# Patient Record
Sex: Male | Born: 1950
Health system: Southern US, Community
[De-identification: ages and names within clinical notes are randomized; demographics above are authoritative.]

## PROBLEM LIST (undated history)

## (undated) DIAGNOSIS — M069 Rheumatoid arthritis, unspecified: Secondary | ICD-10-CM

## (undated) DIAGNOSIS — E785 Hyperlipidemia, unspecified: Secondary | ICD-10-CM

## (undated) DIAGNOSIS — M810 Age-related osteoporosis without current pathological fracture: Secondary | ICD-10-CM

## (undated) HISTORY — DX: Hyperlipidemia, unspecified: E78.5

## (undated) HISTORY — DX: Age-related osteoporosis without current pathological fracture: M81.0

## (undated) HISTORY — DX: Rheumatoid arthritis, unspecified: M06.9

---

## 1999-08-23 ENCOUNTER — Ambulatory Visit (HOSPITAL_COMMUNITY): Admission: RE | Admit: 1999-08-23 | Discharge: 1999-08-23 | Payer: Self-pay | Admitting: *Deleted

## 2004-10-04 ENCOUNTER — Ambulatory Visit (HOSPITAL_COMMUNITY): Admission: RE | Admit: 2004-10-04 | Discharge: 2004-10-04 | Payer: Self-pay | Admitting: *Deleted

## 2007-12-26 ENCOUNTER — Ambulatory Visit: Admission: RE | Admit: 2007-12-26 | Discharge: 2008-02-20 | Payer: Self-pay | Admitting: Radiation Oncology

## 2008-03-04 ENCOUNTER — Ambulatory Visit (HOSPITAL_COMMUNITY): Admission: RE | Admit: 2008-03-04 | Discharge: 2008-03-05 | Payer: Self-pay | Admitting: Orthopaedic Surgery

## 2008-05-26 ENCOUNTER — Encounter (INDEPENDENT_AMBULATORY_CARE_PROVIDER_SITE_OTHER): Payer: Self-pay | Admitting: Urology

## 2008-05-26 ENCOUNTER — Inpatient Hospital Stay (HOSPITAL_COMMUNITY): Admission: RE | Admit: 2008-05-26 | Discharge: 2008-05-27 | Payer: Self-pay | Admitting: Urology
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
## 2010-11-09 NOTE — Op Note (Signed)
NAME:  Eric Solomon, Eric Solomon NO.:  [id number]   MEDICAL RECORD NO.:  [id number]          PATIENT TYPE:  INP   LOCATION:  1423                         FACILITY:  [facility]   PHYSICIAN:  Heloise Purpura, MD      DATE OF BIRTH:  [birth year]   DATE OF PROCEDURE:  05/26/2008  DATE OF DISCHARGE:                               OPERATIVE REPORT   PREOPERATIVE DIAGNOSIS:  Clinically localized adenocarcinoma of the  prostate (clinical stage T1C, NX, MX).   POSTOPERATIVE DIAGNOSIS:  Clinically localized adenocarcinoma of the  prostate (clinical stage T1C, NX, MX).   PROCEDURE:  Robotic assisted laparoscopic radical prostatectomy  (bilateral nerve sparing).   SURGEON:  Heloise Purpura, MD   ASSISTANT:  Delia Chimes, nurse practitioner.   ANESTHESIA:  General.   COMPLICATIONS:  None.   ESTIMATED BLOOD LOSS:  100 mL.   INTRAVENOUS FLUID:  1800 mL of lactated Ringer's.   SPECIMENS:  Prostate and seminal vesicles.   DISPOSITION OF SPECIMENS:  To pathology.   DRAINS:  1. A 20-French coude catheter.  2. A #19 Blake pelvic drain.   INDICATIONS:  Eric Solomon is a 60 year old gentleman with clinically  localized adenocarcinoma of the prostate.  After a discussion regarding  management options for treatment, he elected to proceed with surgical  therapy and the above procedure.  Potential risks, complications, and  alternative treatment options were discussed in detail and informed  consent was obtained.   DESCRIPTION OF PROCEDURE:  The patient was taken to the operating room  and a general anesthetic was administered.  He was given preoperative  antibiotics, placed in the dorsal lithotomy position, prepped and draped  in the usual sterile fashion.  Next, a preoperative time-out was  performed.  A Foley catheter was then inserted into the bladder and a  site was selected just superior to the umbilicus for placement of camera  port.  This was placed using a standard open Hasson  technique which  allowed entry into the peritoneal cavity under direct vision and without  difficulty.  A 12 mm port was then placed and a pneumoperitoneum  established.  The 0 degrees lens was then used to inspect the abdomen  and there was no evidence for any intra-abdominal injuries or other  abnormalities.  The remaining ports were then placed.  Bilateral 8 mm  robotic ports were placed 10 cm lateral to and just inferior to the  camera port site.  An additional 8 mm robotic port was placed in the far  left lateral abdominal wall.  A 5 mm port was placed between the camera  port and the right robotic port and an additional 12 mm was placed in  the far right lateral abdominal wall for laparoscopic assistance.  All  ports were placed under direct vision without difficulty.  The surgical  cart was then docked.  The bladder was then reflected posteriorly with  the aid of the cautery scissors allowing the prostate and endopelvic  fascia to be exposed.  The endopelvic fascia was then incised from the  apex back to the base of  the prostate bilaterally and the underlying  levator muscle fibers were swept laterally off the prostate thereby  isolating the dorsal venous complex.  The dorsal vein was then stapled  and divided with the 60 mm Ethicon stapler.  Attention then turned to  the bladder neck, which was divided anteriorly thereby exposing the  Foley catheter.  The catheter balloon was deflated.  The catheter was  brought into the operative field and used to retract the prostate  anteriorly.  This exposed the posterior bladder neck which was then  divided and dissection proceeded posteriorly between the prostate and  bladder neck until the vasa deferentia and seminal vesicles were  identified.  The vasa deferentia were isolated and divided and lifted  anteriorly.  The seminal vesicles were then dissected down to their tips  with care to control the seminal vesicle arterial blood supply.   The  seminal vesicles were then lifted anteriorly and the space between  Denonvilliers fascia and the anterior rectum was bluntly developed  thereby isolating the vascular pedicles of prostate.  The lateral  prostatic fascia was then incised sharply allowing the neurovascular  bundles to be released bilaterally.  The vascular pedicles of prostate  was then ligated with Hem-o-lok clips and divided with sharp cold  scissor dissection above the level of the neurovascular bundles.  The  neurovascular bundles then swept off the apex of the prostate and  urethra.  The urethra was then sharply divided allowing the prostate  specimen to be disarticulated.  The pelvis was copiously irrigated and  hemostasis was ensured.  There was no evidence for a rectal injury.  Attention then turned to the urethral anastomosis.  A 2-0 Vicryl slip-  knot was placed at the 6 o'clock position between Denonvilliers fascia,  the posterior bladder neck, and the posterior urethra to reapproximate  these structures.  A double-armed 3-0 Monocryl suture was then used  perform a 360 degree running tension-free anastomosis.  A new 20-French  coude catheter was inserted into the bladder and irrigated.  There were  no blood clots within the bladder.  The anastomosis appeared be  watertight.  A #19 Blake drain was then brought through the left robotic  port and appropriately positioned in the pelvis.  It was secured to the  skin with a nylon suture.  The surgical cart was then undocked.  The  right lateral 12 mm port site was then closed with a 0 Vicryl suture  placed with the aid of the suture passer device.  The remaining ports  were then removed under direct vision and prostate specimen was removed  intact within the Endopouch retrieval bag via the periumbilical port  site.  This fascial opening was then closed with a running 0 Vicryl  suture.  All port sites were injected with 0.25% Marcaine and  reapproximated at the  skin level with staples.  Sterile dressings were  applied.  The patient appeared to tolerate the procedure well without  complications.  He was able to be extubated and transferred to recovery  room satisfactory condition.      Heloise Purpura, MD  Electronically Signed     LB/MEDQ  D:  05/26/2008  T:  05/26/2008  Job:  [id number]

## 2010-11-12 NOTE — Op Note (Signed)
NAME:  Eric Solomon, Eric Solomon NO.:  [id number]   MEDICAL RECORD NO.:  [id number]          PATIENT TYPE:  AMB   LOCATION:  ENDO                         FACILITY:  [facility]   PHYSICIAN:  Georgiana Spinner, M.D.    DATE OF BIRTH:  1950/07/24   DATE OF PROCEDURE:  10/04/2004  DATE OF DISCHARGE:                                 OPERATIVE REPORT   PROCEDURE:  Colonoscopy.   ENDOSCOPIST:  Georgiana Spinner, M.D.   INDICATIONS FOR PROCEDURE:  Hemoccult positivity, a positive family history  of colon cancer.   ANESTHESIA:  Demerol 100 mg, Versed 10 mg.   DESCRIPTION OF PROCEDURE:  With the patient mildly sedated in the left  lateral decubitus position, a rectal examination was performed which was  unremarkable except there was a small anal stricture.  Subsequently the  Olympus videoscopic colonoscope was inserted in the rectum and passed under  direct vision through the colon.  Of note, just distal to the hepatic  flexure the colon made a turn, and it appeared that the lumen closed. We  were able to get through this, and then with some difficulty thereafter,  with pressure applied to the abdomen, the patient rolled to his back and  subsequently to his right side, and we were able to reach the cecum  identified by the base of the cecum and the ileocecal valve, both of which  were photographed.  From this point the colonoscope was then slowly  withdrawn, taking circumferential views of the colonic mucosa, stopping only  in the rectum which appeared normal on direct and on retroflexed view.  The  endoscope was straightened and withdrawn.  The patient's vital signs, pulse  oximeter remained stable.   The patient tolerated the procedure well without apparent complications.   FINDINGS:  Negative colonoscopic examination.  No negative findings.   PLAN:  Repeat examination in five years.      GMO/MEDQ  D:  10/04/2004  T:  10/04/2004  Job:  [id number]

## 2011-03-29 LAB — TYPE AND SCREEN
ABO/RH(D): O POS
Antibody Screen: NEGATIVE

## 2011-03-29 LAB — CBC
Hemoglobin: 15.1 g/dL (ref 13.0–17.0)
MCHC: 34 g/dL (ref 30.0–36.0)
Platelets: 171 10*3/uL (ref 150–400)

## 2011-03-29 LAB — HEMOGLOBIN AND HEMATOCRIT, BLOOD
HCT: 40.1 % (ref 39.0–52.0)
Hemoglobin: 13.7 g/dL (ref 13.0–17.0)

## 2011-03-29 LAB — BASIC METABOLIC PANEL
CO2: 29 mEq/L (ref 19–32)
Calcium: 9.7 mg/dL (ref 8.4–10.5)
Chloride: 105 mEq/L (ref 96–112)
GFR calc non Af Amer: >60 mL/min (ref >60)
Glucose, Bld: 137 mg/dL — ABNORMAL HIGH (ref 70–99)
Potassium: 4.3 mEq/L (ref 3.5–5.1)
Sodium: 142 mEq/L (ref 135–145)

## 2011-03-30 LAB — BASIC METABOLIC PANEL
BUN: 15
Calcium: 9.4
Creatinine, Ser: 0.96
Glucose, Bld: 90
Sodium: 138

## 2011-03-30 LAB — CBC
HCT: 43
Hemoglobin: 14.5
MCHC: 33.6
RDW: 13.2
WBC: 5.7

## 2011-04-01 LAB — HEMOGLOBIN AND HEMATOCRIT, BLOOD: HCT: 36.1 % — ABNORMAL LOW (ref 39.0–52.0)

## 2014-06-04 DIAGNOSIS — M0589 Other rheumatoid arthritis with rheumatoid factor of multiple sites: Secondary | ICD-10-CM | POA: Diagnosis not present

## 2014-09-03 DIAGNOSIS — M8580 Other specified disorders of bone density and structure, unspecified site: Secondary | ICD-10-CM | POA: Diagnosis not present

## 2014-09-03 DIAGNOSIS — M0589 Other rheumatoid arthritis with rheumatoid factor of multiple sites: Secondary | ICD-10-CM | POA: Diagnosis not present

## 2014-12-10 DIAGNOSIS — M0589 Other rheumatoid arthritis with rheumatoid factor of multiple sites: Secondary | ICD-10-CM | POA: Diagnosis not present

## 2014-12-10 DIAGNOSIS — Z23 Encounter for immunization: Secondary | ICD-10-CM | POA: Diagnosis not present

## 2014-12-10 DIAGNOSIS — Z0000 Encounter for general adult medical examination without abnormal findings: Secondary | ICD-10-CM | POA: Diagnosis not present

## 2014-12-10 DIAGNOSIS — Z125 Encounter for screening for malignant neoplasm of prostate: Secondary | ICD-10-CM | POA: Diagnosis not present

## 2014-12-10 DIAGNOSIS — Z79899 Other long term (current) drug therapy: Secondary | ICD-10-CM | POA: Diagnosis not present

## 2014-12-15 DIAGNOSIS — M05751 Rheumatoid arthritis with rheumatoid factor of right hip without organ or systems involvement: Secondary | ICD-10-CM | POA: Diagnosis not present

## 2014-12-15 DIAGNOSIS — R312 Other microscopic hematuria: Secondary | ICD-10-CM | POA: Diagnosis not present

## 2014-12-15 DIAGNOSIS — I8390 Asymptomatic varicose veins of unspecified lower extremity: Secondary | ICD-10-CM | POA: Diagnosis not present

## 2014-12-15 DIAGNOSIS — Z0000 Encounter for general adult medical examination without abnormal findings: Secondary | ICD-10-CM | POA: Diagnosis not present

## 2014-12-15 DIAGNOSIS — Z800 Family history of malignant neoplasm of digestive organs: Secondary | ICD-10-CM | POA: Diagnosis not present

## 2015-01-19 DIAGNOSIS — Z86010 Personal history of colonic polyps: Secondary | ICD-10-CM | POA: Diagnosis not present

## 2015-01-19 DIAGNOSIS — Z09 Encounter for follow-up examination after completed treatment for conditions other than malignant neoplasm: Secondary | ICD-10-CM | POA: Diagnosis not present

## 2015-03-05 DIAGNOSIS — Z79899 Other long term (current) drug therapy: Secondary | ICD-10-CM | POA: Diagnosis not present

## 2015-03-05 DIAGNOSIS — M0589 Other rheumatoid arthritis with rheumatoid factor of multiple sites: Secondary | ICD-10-CM | POA: Diagnosis not present

## 2015-03-05 DIAGNOSIS — M8580 Other specified disorders of bone density and structure, unspecified site: Secondary | ICD-10-CM | POA: Diagnosis not present

## 2015-04-21 DIAGNOSIS — Z23 Encounter for immunization: Secondary | ICD-10-CM | POA: Diagnosis not present

## 2015-06-04 DIAGNOSIS — Z79899 Other long term (current) drug therapy: Secondary | ICD-10-CM | POA: Diagnosis not present

## 2015-06-04 DIAGNOSIS — M0589 Other rheumatoid arthritis with rheumatoid factor of multiple sites: Secondary | ICD-10-CM | POA: Diagnosis not present

## 2015-06-08 DIAGNOSIS — H5203 Hypermetropia, bilateral: Secondary | ICD-10-CM | POA: Diagnosis not present

## 2015-06-08 DIAGNOSIS — H524 Presbyopia: Secondary | ICD-10-CM | POA: Diagnosis not present

## 2015-06-08 DIAGNOSIS — H52221 Regular astigmatism, right eye: Secondary | ICD-10-CM | POA: Diagnosis not present

## 2015-06-08 DIAGNOSIS — H2513 Age-related nuclear cataract, bilateral: Secondary | ICD-10-CM | POA: Diagnosis not present

## 2015-09-02 DIAGNOSIS — M0589 Other rheumatoid arthritis with rheumatoid factor of multiple sites: Secondary | ICD-10-CM | POA: Diagnosis not present

## 2015-09-02 DIAGNOSIS — Z79899 Other long term (current) drug therapy: Secondary | ICD-10-CM | POA: Diagnosis not present

## 2015-09-02 DIAGNOSIS — M8580 Other specified disorders of bone density and structure, unspecified site: Secondary | ICD-10-CM | POA: Diagnosis not present

## 2015-12-09 DIAGNOSIS — M810 Age-related osteoporosis without current pathological fracture: Secondary | ICD-10-CM | POA: Diagnosis not present

## 2015-12-14 DIAGNOSIS — Z125 Encounter for screening for malignant neoplasm of prostate: Secondary | ICD-10-CM | POA: Diagnosis not present

## 2015-12-14 DIAGNOSIS — E7800 Pure hypercholesterolemia, unspecified: Secondary | ICD-10-CM | POA: Diagnosis not present

## 2015-12-14 DIAGNOSIS — Z0000 Encounter for general adult medical examination without abnormal findings: Secondary | ICD-10-CM | POA: Diagnosis not present

## 2015-12-14 DIAGNOSIS — M05751 Rheumatoid arthritis with rheumatoid factor of right hip without organ or systems involvement: Secondary | ICD-10-CM | POA: Diagnosis not present

## 2015-12-21 DIAGNOSIS — I8390 Asymptomatic varicose veins of unspecified lower extremity: Secondary | ICD-10-CM | POA: Diagnosis not present

## 2015-12-21 DIAGNOSIS — M2010 Hallux valgus (acquired), unspecified foot: Secondary | ICD-10-CM | POA: Diagnosis not present

## 2015-12-21 DIAGNOSIS — M05751 Rheumatoid arthritis with rheumatoid factor of right hip without organ or systems involvement: Secondary | ICD-10-CM | POA: Diagnosis not present

## 2015-12-21 DIAGNOSIS — M810 Age-related osteoporosis without current pathological fracture: Secondary | ICD-10-CM | POA: Diagnosis not present

## 2015-12-22 DIAGNOSIS — N4340 Spermatocele of epididymis, unspecified: Secondary | ICD-10-CM | POA: Diagnosis not present

## 2015-12-22 DIAGNOSIS — Z79899 Other long term (current) drug therapy: Secondary | ICD-10-CM | POA: Diagnosis not present

## 2015-12-22 DIAGNOSIS — Z9079 Acquired absence of other genital organ(s): Secondary | ICD-10-CM | POA: Diagnosis not present

## 2015-12-22 DIAGNOSIS — M810 Age-related osteoporosis without current pathological fracture: Secondary | ICD-10-CM | POA: Diagnosis not present

## 2016-01-28 DIAGNOSIS — M810 Age-related osteoporosis without current pathological fracture: Secondary | ICD-10-CM | POA: Diagnosis not present

## 2016-02-22 ENCOUNTER — Other Ambulatory Visit: Payer: Self-pay

## 2016-03-07 DIAGNOSIS — M8580 Other specified disorders of bone density and structure, unspecified site: Secondary | ICD-10-CM | POA: Diagnosis not present

## 2016-03-07 DIAGNOSIS — Z23 Encounter for immunization: Secondary | ICD-10-CM | POA: Diagnosis not present

## 2016-03-07 DIAGNOSIS — Z79899 Other long term (current) drug therapy: Secondary | ICD-10-CM | POA: Diagnosis not present

## 2016-03-07 DIAGNOSIS — M0589 Other rheumatoid arthritis with rheumatoid factor of multiple sites: Secondary | ICD-10-CM | POA: Diagnosis not present

## 2016-06-06 DIAGNOSIS — Z79899 Other long term (current) drug therapy: Secondary | ICD-10-CM | POA: Diagnosis not present

## 2016-06-06 DIAGNOSIS — M0589 Other rheumatoid arthritis with rheumatoid factor of multiple sites: Secondary | ICD-10-CM | POA: Diagnosis not present

## 2016-08-02 DIAGNOSIS — M810 Age-related osteoporosis without current pathological fracture: Secondary | ICD-10-CM | POA: Diagnosis not present

## 2016-09-08 DIAGNOSIS — M05751 Rheumatoid arthritis with rheumatoid factor of right hip without organ or systems involvement: Secondary | ICD-10-CM | POA: Diagnosis not present

## 2016-09-08 DIAGNOSIS — S63233A Subluxation of proximal interphalangeal joint of left middle finger, initial encounter: Secondary | ICD-10-CM | POA: Diagnosis not present

## 2016-09-08 DIAGNOSIS — S63235A Subluxation of proximal interphalangeal joint of left ring finger, initial encounter: Secondary | ICD-10-CM | POA: Diagnosis not present

## 2016-09-08 DIAGNOSIS — M0589 Other rheumatoid arthritis with rheumatoid factor of multiple sites: Secondary | ICD-10-CM | POA: Diagnosis not present

## 2016-09-08 DIAGNOSIS — S63237A Subluxation of proximal interphalangeal joint of left little finger, initial encounter: Secondary | ICD-10-CM | POA: Diagnosis not present

## 2016-09-30 DIAGNOSIS — M0589 Other rheumatoid arthritis with rheumatoid factor of multiple sites: Secondary | ICD-10-CM | POA: Diagnosis not present

## 2016-09-30 DIAGNOSIS — Z79899 Other long term (current) drug therapy: Secondary | ICD-10-CM | POA: Diagnosis not present

## 2016-09-30 DIAGNOSIS — M05751 Rheumatoid arthritis with rheumatoid factor of right hip without organ or systems involvement: Secondary | ICD-10-CM | POA: Diagnosis not present

## 2016-11-03 DIAGNOSIS — J019 Acute sinusitis, unspecified: Secondary | ICD-10-CM | POA: Diagnosis not present

## 2016-11-23 DIAGNOSIS — H2513 Age-related nuclear cataract, bilateral: Secondary | ICD-10-CM | POA: Diagnosis not present

## 2016-11-23 DIAGNOSIS — H52223 Regular astigmatism, bilateral: Secondary | ICD-10-CM | POA: Diagnosis not present

## 2016-11-23 DIAGNOSIS — H5203 Hypermetropia, bilateral: Secondary | ICD-10-CM | POA: Diagnosis not present

## 2016-12-21 DIAGNOSIS — Z125 Encounter for screening for malignant neoplasm of prostate: Secondary | ICD-10-CM | POA: Diagnosis not present

## 2016-12-21 DIAGNOSIS — E559 Vitamin D deficiency, unspecified: Secondary | ICD-10-CM | POA: Diagnosis not present

## 2016-12-21 DIAGNOSIS — M05751 Rheumatoid arthritis with rheumatoid factor of right hip without organ or systems involvement: Secondary | ICD-10-CM | POA: Diagnosis not present

## 2016-12-21 DIAGNOSIS — Z0000 Encounter for general adult medical examination without abnormal findings: Secondary | ICD-10-CM | POA: Diagnosis not present

## 2016-12-21 DIAGNOSIS — Z23 Encounter for immunization: Secondary | ICD-10-CM | POA: Diagnosis not present

## 2016-12-21 DIAGNOSIS — M0589 Other rheumatoid arthritis with rheumatoid factor of multiple sites: Secondary | ICD-10-CM | POA: Diagnosis not present

## 2016-12-21 DIAGNOSIS — E7801 Familial hypercholesterolemia: Secondary | ICD-10-CM | POA: Diagnosis not present

## 2016-12-26 DIAGNOSIS — Z7289 Other problems related to lifestyle: Secondary | ICD-10-CM | POA: Diagnosis not present

## 2016-12-26 DIAGNOSIS — M05751 Rheumatoid arthritis with rheumatoid factor of right hip without organ or systems involvement: Secondary | ICD-10-CM | POA: Diagnosis not present

## 2016-12-26 DIAGNOSIS — Z800 Family history of malignant neoplasm of digestive organs: Secondary | ICD-10-CM | POA: Diagnosis not present

## 2016-12-26 DIAGNOSIS — Z0001 Encounter for general adult medical examination with abnormal findings: Secondary | ICD-10-CM | POA: Diagnosis not present

## 2016-12-26 DIAGNOSIS — Z8546 Personal history of malignant neoplasm of prostate: Secondary | ICD-10-CM | POA: Diagnosis not present

## 2016-12-26 DIAGNOSIS — J309 Allergic rhinitis, unspecified: Secondary | ICD-10-CM | POA: Diagnosis not present

## 2017-02-07 DIAGNOSIS — M810 Age-related osteoporosis without current pathological fracture: Secondary | ICD-10-CM | POA: Diagnosis not present

## 2017-03-31 DIAGNOSIS — Z23 Encounter for immunization: Secondary | ICD-10-CM | POA: Diagnosis not present

## 2017-03-31 DIAGNOSIS — M8580 Other specified disorders of bone density and structure, unspecified site: Secondary | ICD-10-CM | POA: Diagnosis not present

## 2017-03-31 DIAGNOSIS — M0589 Other rheumatoid arthritis with rheumatoid factor of multiple sites: Secondary | ICD-10-CM | POA: Diagnosis not present

## 2017-03-31 DIAGNOSIS — Z79899 Other long term (current) drug therapy: Secondary | ICD-10-CM | POA: Diagnosis not present

## 2017-08-15 DIAGNOSIS — M810 Age-related osteoporosis without current pathological fracture: Secondary | ICD-10-CM | POA: Diagnosis not present

## 2017-10-20 DIAGNOSIS — M19042 Primary osteoarthritis, left hand: Secondary | ICD-10-CM | POA: Diagnosis not present

## 2017-10-20 DIAGNOSIS — M8580 Other specified disorders of bone density and structure, unspecified site: Secondary | ICD-10-CM | POA: Diagnosis not present

## 2017-10-20 DIAGNOSIS — M79641 Pain in right hand: Secondary | ICD-10-CM | POA: Diagnosis not present

## 2017-10-20 DIAGNOSIS — M19041 Primary osteoarthritis, right hand: Secondary | ICD-10-CM | POA: Diagnosis not present

## 2017-10-20 DIAGNOSIS — M0589 Other rheumatoid arthritis with rheumatoid factor of multiple sites: Secondary | ICD-10-CM | POA: Diagnosis not present

## 2017-10-20 DIAGNOSIS — M79642 Pain in left hand: Secondary | ICD-10-CM | POA: Diagnosis not present

## 2017-10-20 DIAGNOSIS — Z79899 Other long term (current) drug therapy: Secondary | ICD-10-CM | POA: Diagnosis not present

## 2017-12-27 DIAGNOSIS — M8580 Other specified disorders of bone density and structure, unspecified site: Secondary | ICD-10-CM | POA: Diagnosis not present

## 2017-12-27 DIAGNOSIS — E559 Vitamin D deficiency, unspecified: Secondary | ICD-10-CM | POA: Diagnosis not present

## 2017-12-27 DIAGNOSIS — N390 Urinary tract infection, site not specified: Secondary | ICD-10-CM | POA: Diagnosis not present

## 2017-12-27 DIAGNOSIS — Z125 Encounter for screening for malignant neoplasm of prostate: Secondary | ICD-10-CM | POA: Diagnosis not present

## 2017-12-27 DIAGNOSIS — E7801 Familial hypercholesterolemia: Secondary | ICD-10-CM | POA: Diagnosis not present

## 2018-01-01 DIAGNOSIS — M05751 Rheumatoid arthritis with rheumatoid factor of right hip without organ or systems involvement: Secondary | ICD-10-CM | POA: Diagnosis not present

## 2018-01-01 DIAGNOSIS — Z800 Family history of malignant neoplasm of digestive organs: Secondary | ICD-10-CM | POA: Diagnosis not present

## 2018-01-01 DIAGNOSIS — N4340 Spermatocele of epididymis, unspecified: Secondary | ICD-10-CM | POA: Diagnosis not present

## 2018-01-01 DIAGNOSIS — Z85828 Personal history of other malignant neoplasm of skin: Secondary | ICD-10-CM | POA: Diagnosis not present

## 2018-01-01 DIAGNOSIS — M810 Age-related osteoporosis without current pathological fracture: Secondary | ICD-10-CM | POA: Diagnosis not present

## 2018-01-01 DIAGNOSIS — Z8546 Personal history of malignant neoplasm of prostate: Secondary | ICD-10-CM | POA: Diagnosis not present

## 2018-01-01 DIAGNOSIS — Z1212 Encounter for screening for malignant neoplasm of rectum: Secondary | ICD-10-CM | POA: Diagnosis not present

## 2018-01-01 DIAGNOSIS — I8390 Asymptomatic varicose veins of unspecified lower extremity: Secondary | ICD-10-CM | POA: Diagnosis not present

## 2018-01-01 DIAGNOSIS — Z9079 Acquired absence of other genital organ(s): Secondary | ICD-10-CM | POA: Diagnosis not present

## 2018-01-01 DIAGNOSIS — M2010 Hallux valgus (acquired), unspecified foot: Secondary | ICD-10-CM | POA: Diagnosis not present

## 2018-01-01 DIAGNOSIS — Z0000 Encounter for general adult medical examination without abnormal findings: Secondary | ICD-10-CM | POA: Diagnosis not present

## 2018-01-01 DIAGNOSIS — J309 Allergic rhinitis, unspecified: Secondary | ICD-10-CM | POA: Diagnosis not present

## 2018-01-01 DIAGNOSIS — Z79899 Other long term (current) drug therapy: Secondary | ICD-10-CM | POA: Diagnosis not present

## 2018-01-08 DIAGNOSIS — M810 Age-related osteoporosis without current pathological fracture: Secondary | ICD-10-CM | POA: Diagnosis not present

## 2018-02-14 DIAGNOSIS — M810 Age-related osteoporosis without current pathological fracture: Secondary | ICD-10-CM | POA: Diagnosis not present

## 2018-03-23 DIAGNOSIS — Z23 Encounter for immunization: Secondary | ICD-10-CM | POA: Diagnosis not present

## 2018-05-09 DIAGNOSIS — M8580 Other specified disorders of bone density and structure, unspecified site: Secondary | ICD-10-CM | POA: Diagnosis not present

## 2018-05-09 DIAGNOSIS — M0589 Other rheumatoid arthritis with rheumatoid factor of multiple sites: Secondary | ICD-10-CM | POA: Diagnosis not present

## 2018-05-09 DIAGNOSIS — Z79899 Other long term (current) drug therapy: Secondary | ICD-10-CM | POA: Diagnosis not present

## 2018-08-09 DIAGNOSIS — Z79899 Other long term (current) drug therapy: Secondary | ICD-10-CM | POA: Diagnosis not present

## 2018-08-09 DIAGNOSIS — M0589 Other rheumatoid arthritis with rheumatoid factor of multiple sites: Secondary | ICD-10-CM | POA: Diagnosis not present

## 2018-08-22 DIAGNOSIS — M810 Age-related osteoporosis without current pathological fracture: Secondary | ICD-10-CM | POA: Diagnosis not present

## 2018-11-07 DIAGNOSIS — Z79899 Other long term (current) drug therapy: Secondary | ICD-10-CM | POA: Diagnosis not present

## 2018-11-07 DIAGNOSIS — M8580 Other specified disorders of bone density and structure, unspecified site: Secondary | ICD-10-CM | POA: Diagnosis not present

## 2018-11-07 DIAGNOSIS — M0589 Other rheumatoid arthritis with rheumatoid factor of multiple sites: Secondary | ICD-10-CM | POA: Diagnosis not present

## 2018-11-07 DIAGNOSIS — Z8546 Personal history of malignant neoplasm of prostate: Secondary | ICD-10-CM | POA: Diagnosis not present

## 2019-02-21 DIAGNOSIS — M810 Age-related osteoporosis without current pathological fracture: Secondary | ICD-10-CM | POA: Diagnosis not present

## 2019-02-27 DIAGNOSIS — T148XXA Other injury of unspecified body region, initial encounter: Secondary | ICD-10-CM | POA: Diagnosis not present

## 2019-02-27 DIAGNOSIS — D2262 Melanocytic nevi of left upper limb, including shoulder: Secondary | ICD-10-CM | POA: Diagnosis not present

## 2019-02-27 DIAGNOSIS — D485 Neoplasm of uncertain behavior of skin: Secondary | ICD-10-CM | POA: Diagnosis not present

## 2019-03-06 DIAGNOSIS — H2513 Age-related nuclear cataract, bilateral: Secondary | ICD-10-CM | POA: Diagnosis not present

## 2019-03-06 DIAGNOSIS — H40053 Ocular hypertension, bilateral: Secondary | ICD-10-CM | POA: Diagnosis not present

## 2019-03-06 DIAGNOSIS — H524 Presbyopia: Secondary | ICD-10-CM | POA: Diagnosis not present

## 2019-03-06 DIAGNOSIS — H5203 Hypermetropia, bilateral: Secondary | ICD-10-CM | POA: Diagnosis not present

## 2019-03-06 DIAGNOSIS — H52223 Regular astigmatism, bilateral: Secondary | ICD-10-CM | POA: Diagnosis not present

## 2019-03-06 DIAGNOSIS — H40013 Open angle with borderline findings, low risk, bilateral: Secondary | ICD-10-CM | POA: Diagnosis not present

## 2019-03-27 DIAGNOSIS — T148XXA Other injury of unspecified body region, initial encounter: Secondary | ICD-10-CM | POA: Diagnosis not present

## 2019-03-27 DIAGNOSIS — D2262 Melanocytic nevi of left upper limb, including shoulder: Secondary | ICD-10-CM | POA: Diagnosis not present

## 2019-04-05 DIAGNOSIS — Z23 Encounter for immunization: Secondary | ICD-10-CM | POA: Diagnosis not present

## 2019-05-13 DIAGNOSIS — M8580 Other specified disorders of bone density and structure, unspecified site: Secondary | ICD-10-CM | POA: Diagnosis not present

## 2019-05-13 DIAGNOSIS — M0589 Other rheumatoid arthritis with rheumatoid factor of multiple sites: Secondary | ICD-10-CM | POA: Diagnosis not present

## 2019-05-13 DIAGNOSIS — Z8546 Personal history of malignant neoplasm of prostate: Secondary | ICD-10-CM | POA: Diagnosis not present

## 2019-05-13 DIAGNOSIS — Z79899 Other long term (current) drug therapy: Secondary | ICD-10-CM | POA: Diagnosis not present

## 2019-08-28 DIAGNOSIS — M810 Age-related osteoporosis without current pathological fracture: Secondary | ICD-10-CM | POA: Diagnosis not present

## 2019-09-03 DIAGNOSIS — H524 Presbyopia: Secondary | ICD-10-CM | POA: Diagnosis not present

## 2019-09-03 DIAGNOSIS — H2513 Age-related nuclear cataract, bilateral: Secondary | ICD-10-CM | POA: Diagnosis not present

## 2019-09-03 DIAGNOSIS — H5203 Hypermetropia, bilateral: Secondary | ICD-10-CM | POA: Diagnosis not present

## 2019-09-03 DIAGNOSIS — H52223 Regular astigmatism, bilateral: Secondary | ICD-10-CM | POA: Diagnosis not present

## 2019-09-03 DIAGNOSIS — H40053 Ocular hypertension, bilateral: Secondary | ICD-10-CM | POA: Diagnosis not present

## 2019-11-19 DIAGNOSIS — Z79899 Other long term (current) drug therapy: Secondary | ICD-10-CM | POA: Diagnosis not present

## 2019-11-19 DIAGNOSIS — M8580 Other specified disorders of bone density and structure, unspecified site: Secondary | ICD-10-CM | POA: Diagnosis not present

## 2019-11-19 DIAGNOSIS — M545 Low back pain: Secondary | ICD-10-CM | POA: Diagnosis not present

## 2019-11-19 DIAGNOSIS — M25559 Pain in unspecified hip: Secondary | ICD-10-CM | POA: Diagnosis not present

## 2019-11-19 DIAGNOSIS — Z8546 Personal history of malignant neoplasm of prostate: Secondary | ICD-10-CM | POA: Diagnosis not present

## 2019-11-19 DIAGNOSIS — M0589 Other rheumatoid arthritis with rheumatoid factor of multiple sites: Secondary | ICD-10-CM | POA: Diagnosis not present

## 2019-11-27 DIAGNOSIS — E7800 Pure hypercholesterolemia, unspecified: Secondary | ICD-10-CM | POA: Diagnosis not present

## 2019-11-27 DIAGNOSIS — Z125 Encounter for screening for malignant neoplasm of prostate: Secondary | ICD-10-CM | POA: Diagnosis not present

## 2019-12-04 DIAGNOSIS — Z1212 Encounter for screening for malignant neoplasm of rectum: Secondary | ICD-10-CM | POA: Diagnosis not present

## 2019-12-04 DIAGNOSIS — I8393 Asymptomatic varicose veins of bilateral lower extremities: Secondary | ICD-10-CM | POA: Diagnosis not present

## 2019-12-04 DIAGNOSIS — M810 Age-related osteoporosis without current pathological fracture: Secondary | ICD-10-CM | POA: Diagnosis not present

## 2019-12-04 DIAGNOSIS — Z0000 Encounter for general adult medical examination without abnormal findings: Secondary | ICD-10-CM | POA: Diagnosis not present

## 2019-12-04 DIAGNOSIS — M05751 Rheumatoid arthritis with rheumatoid factor of right hip without organ or systems involvement: Secondary | ICD-10-CM | POA: Diagnosis not present

## 2019-12-04 DIAGNOSIS — E7800 Pure hypercholesterolemia, unspecified: Secondary | ICD-10-CM | POA: Diagnosis not present

## 2019-12-04 DIAGNOSIS — J309 Allergic rhinitis, unspecified: Secondary | ICD-10-CM | POA: Diagnosis not present

## 2019-12-04 DIAGNOSIS — Z8546 Personal history of malignant neoplasm of prostate: Secondary | ICD-10-CM | POA: Diagnosis not present

## 2019-12-04 DIAGNOSIS — Z0001 Encounter for general adult medical examination with abnormal findings: Secondary | ICD-10-CM | POA: Diagnosis not present

## 2019-12-04 DIAGNOSIS — R3129 Other microscopic hematuria: Secondary | ICD-10-CM | POA: Diagnosis not present

## 2019-12-04 DIAGNOSIS — Z79899 Other long term (current) drug therapy: Secondary | ICD-10-CM | POA: Diagnosis not present

## 2020-01-14 DIAGNOSIS — M810 Age-related osteoporosis without current pathological fracture: Secondary | ICD-10-CM | POA: Diagnosis not present

## 2020-01-14 DIAGNOSIS — E7800 Pure hypercholesterolemia, unspecified: Secondary | ICD-10-CM | POA: Diagnosis not present

## 2020-03-03 DIAGNOSIS — M810 Age-related osteoporosis without current pathological fracture: Secondary | ICD-10-CM | POA: Diagnosis not present

## 2020-03-20 DIAGNOSIS — Z23 Encounter for immunization: Secondary | ICD-10-CM | POA: Diagnosis not present

## 2020-03-26 DIAGNOSIS — Z23 Encounter for immunization: Secondary | ICD-10-CM | POA: Diagnosis not present

## 2020-04-01 DIAGNOSIS — H40013 Open angle with borderline findings, low risk, bilateral: Secondary | ICD-10-CM | POA: Diagnosis not present

## 2020-04-01 DIAGNOSIS — H40053 Ocular hypertension, bilateral: Secondary | ICD-10-CM | POA: Diagnosis not present

## 2020-04-13 DIAGNOSIS — Z1159 Encounter for screening for other viral diseases: Secondary | ICD-10-CM | POA: Diagnosis not present

## 2020-04-15 DIAGNOSIS — Z800 Family history of malignant neoplasm of digestive organs: Secondary | ICD-10-CM | POA: Diagnosis not present

## 2020-05-26 DIAGNOSIS — Z8546 Personal history of malignant neoplasm of prostate: Secondary | ICD-10-CM | POA: Diagnosis not present

## 2020-05-26 DIAGNOSIS — M5459 Other low back pain: Secondary | ICD-10-CM | POA: Diagnosis not present

## 2020-05-26 DIAGNOSIS — M8580 Other specified disorders of bone density and structure, unspecified site: Secondary | ICD-10-CM | POA: Diagnosis not present

## 2020-05-26 DIAGNOSIS — M0589 Other rheumatoid arthritis with rheumatoid factor of multiple sites: Secondary | ICD-10-CM | POA: Diagnosis not present

## 2020-05-26 DIAGNOSIS — M25559 Pain in unspecified hip: Secondary | ICD-10-CM | POA: Diagnosis not present

## 2020-05-26 DIAGNOSIS — Z79899 Other long term (current) drug therapy: Secondary | ICD-10-CM | POA: Diagnosis not present

## 2020-09-02 DIAGNOSIS — M810 Age-related osteoporosis without current pathological fracture: Secondary | ICD-10-CM | POA: Diagnosis not present

## 2020-09-22 DIAGNOSIS — Z8546 Personal history of malignant neoplasm of prostate: Secondary | ICD-10-CM | POA: Diagnosis not present

## 2020-09-22 DIAGNOSIS — M25559 Pain in unspecified hip: Secondary | ICD-10-CM | POA: Diagnosis not present

## 2020-09-22 DIAGNOSIS — Z79899 Other long term (current) drug therapy: Secondary | ICD-10-CM | POA: Diagnosis not present

## 2020-09-22 DIAGNOSIS — M0589 Other rheumatoid arthritis with rheumatoid factor of multiple sites: Secondary | ICD-10-CM | POA: Diagnosis not present

## 2020-09-22 DIAGNOSIS — M5459 Other low back pain: Secondary | ICD-10-CM | POA: Diagnosis not present

## 2020-09-22 DIAGNOSIS — M8580 Other specified disorders of bone density and structure, unspecified site: Secondary | ICD-10-CM | POA: Diagnosis not present

## 2020-09-30 DIAGNOSIS — H52221 Regular astigmatism, right eye: Secondary | ICD-10-CM | POA: Diagnosis not present

## 2020-09-30 DIAGNOSIS — H2513 Age-related nuclear cataract, bilateral: Secondary | ICD-10-CM | POA: Diagnosis not present

## 2020-09-30 DIAGNOSIS — H524 Presbyopia: Secondary | ICD-10-CM | POA: Diagnosis not present

## 2020-09-30 DIAGNOSIS — H40003 Preglaucoma, unspecified, bilateral: Secondary | ICD-10-CM | POA: Diagnosis not present

## 2020-09-30 DIAGNOSIS — H40053 Ocular hypertension, bilateral: Secondary | ICD-10-CM | POA: Diagnosis not present

## 2020-09-30 DIAGNOSIS — H40013 Open angle with borderline findings, low risk, bilateral: Secondary | ICD-10-CM | POA: Diagnosis not present

## 2020-09-30 DIAGNOSIS — H5203 Hypermetropia, bilateral: Secondary | ICD-10-CM | POA: Diagnosis not present

## 2020-10-14 DIAGNOSIS — Z23 Encounter for immunization: Secondary | ICD-10-CM | POA: Diagnosis not present

## 2020-12-02 DIAGNOSIS — Z125 Encounter for screening for malignant neoplasm of prostate: Secondary | ICD-10-CM | POA: Diagnosis not present

## 2020-12-02 DIAGNOSIS — E7800 Pure hypercholesterolemia, unspecified: Secondary | ICD-10-CM | POA: Diagnosis not present

## 2020-12-09 DIAGNOSIS — J309 Allergic rhinitis, unspecified: Secondary | ICD-10-CM | POA: Diagnosis not present

## 2020-12-09 DIAGNOSIS — E7800 Pure hypercholesterolemia, unspecified: Secondary | ICD-10-CM | POA: Diagnosis not present

## 2020-12-09 DIAGNOSIS — Z0001 Encounter for general adult medical examination with abnormal findings: Secondary | ICD-10-CM | POA: Diagnosis not present

## 2020-12-09 DIAGNOSIS — M0589 Other rheumatoid arthritis with rheumatoid factor of multiple sites: Secondary | ICD-10-CM | POA: Diagnosis not present

## 2020-12-09 DIAGNOSIS — M810 Age-related osteoporosis without current pathological fracture: Secondary | ICD-10-CM | POA: Diagnosis not present

## 2020-12-17 ENCOUNTER — Other Ambulatory Visit: Payer: Self-pay | Admitting: Internal Medicine

## 2020-12-17 DIAGNOSIS — E7800 Pure hypercholesterolemia, unspecified: Secondary | ICD-10-CM

## 2021-01-21 ENCOUNTER — Ambulatory Visit
Admission: RE | Admit: 2021-01-21 | Discharge: 2021-01-21 | Disposition: A | Discharge status: Home / Self Care | Payer: No Typology Code available for payment source | Source: Ambulatory Visit | Attending: Internal Medicine | Admitting: Internal Medicine

## 2021-01-21 DIAGNOSIS — E7800 Pure hypercholesterolemia, unspecified: Secondary | ICD-10-CM

## 2021-01-27 DIAGNOSIS — I2584 Coronary atherosclerosis due to calcified coronary lesion: Secondary | ICD-10-CM | POA: Diagnosis not present

## 2021-01-27 DIAGNOSIS — R918 Other nonspecific abnormal finding of lung field: Secondary | ICD-10-CM | POA: Diagnosis not present

## 2021-01-27 DIAGNOSIS — I2510 Atherosclerotic heart disease of native coronary artery without angina pectoris: Secondary | ICD-10-CM | POA: Diagnosis not present

## 2021-02-02 ENCOUNTER — Other Ambulatory Visit (HOSPITAL_COMMUNITY): Payer: Self-pay | Admitting: Internal Medicine

## 2021-02-02 DIAGNOSIS — I2510 Atherosclerotic heart disease of native coronary artery without angina pectoris: Secondary | ICD-10-CM

## 2021-02-02 DIAGNOSIS — I2584 Coronary atherosclerosis due to calcified coronary lesion: Secondary | ICD-10-CM

## 2021-02-22 ENCOUNTER — Other Ambulatory Visit: Payer: Self-pay

## 2021-02-22 ENCOUNTER — Ambulatory Visit (HOSPITAL_COMMUNITY): Payer: Medicare Other | Attending: Cardiovascular Disease

## 2021-02-22 DIAGNOSIS — I2510 Atherosclerotic heart disease of native coronary artery without angina pectoris: Secondary | ICD-10-CM | POA: Diagnosis not present

## 2021-02-22 DIAGNOSIS — I2584 Coronary atherosclerosis due to calcified coronary lesion: Secondary | ICD-10-CM | POA: Diagnosis not present

## 2021-02-22 LAB — ECHOCARDIOGRAM COMPLETE
AR max vel: 2.26 cm2
AV Area VTI: 2.22 cm2
AV Area mean vel: 2.18 cm2
AV Mean grad: 5 mmHg
AV Peak grad: 8.8 mmHg
Ao pk vel: 1.48 m/s
Area-P 1/2: 3.17 cm2
P 1/2 time: 603 msec
S' Lateral: 2.8 cm

## 2021-03-04 DIAGNOSIS — M25512 Pain in left shoulder: Secondary | ICD-10-CM | POA: Diagnosis not present

## 2021-03-04 DIAGNOSIS — M25812 Other specified joint disorders, left shoulder: Secondary | ICD-10-CM | POA: Diagnosis not present

## 2021-03-08 DIAGNOSIS — Z23 Encounter for immunization: Secondary | ICD-10-CM | POA: Diagnosis not present

## 2021-03-10 DIAGNOSIS — M810 Age-related osteoporosis without current pathological fracture: Secondary | ICD-10-CM | POA: Diagnosis not present

## 2021-03-25 DIAGNOSIS — M25512 Pain in left shoulder: Secondary | ICD-10-CM | POA: Diagnosis not present

## 2021-03-25 DIAGNOSIS — M5459 Other low back pain: Secondary | ICD-10-CM | POA: Diagnosis not present

## 2021-03-25 DIAGNOSIS — M8580 Other specified disorders of bone density and structure, unspecified site: Secondary | ICD-10-CM | POA: Diagnosis not present

## 2021-03-25 DIAGNOSIS — M25559 Pain in unspecified hip: Secondary | ICD-10-CM | POA: Diagnosis not present

## 2021-03-25 DIAGNOSIS — M0589 Other rheumatoid arthritis with rheumatoid factor of multiple sites: Secondary | ICD-10-CM | POA: Diagnosis not present

## 2021-03-25 DIAGNOSIS — Z8546 Personal history of malignant neoplasm of prostate: Secondary | ICD-10-CM | POA: Diagnosis not present

## 2021-03-25 DIAGNOSIS — Z79899 Other long term (current) drug therapy: Secondary | ICD-10-CM | POA: Diagnosis not present

## 2021-04-06 ENCOUNTER — Ambulatory Visit (INDEPENDENT_AMBULATORY_CARE_PROVIDER_SITE_OTHER): Payer: Medicare Other | Admitting: Internal Medicine

## 2021-04-06 ENCOUNTER — Other Ambulatory Visit: Payer: Self-pay

## 2021-04-06 ENCOUNTER — Encounter: Payer: Self-pay | Admitting: Internal Medicine

## 2021-04-06 VITALS — BP 140/73 | HR 74 | Ht 75.0 in | Wt 196.4 lb

## 2021-04-06 DIAGNOSIS — E785 Hyperlipidemia, unspecified: Secondary | ICD-10-CM

## 2021-04-06 DIAGNOSIS — M069 Rheumatoid arthritis, unspecified: Secondary | ICD-10-CM | POA: Diagnosis not present

## 2021-04-06 DIAGNOSIS — M810 Age-related osteoporosis without current pathological fracture: Secondary | ICD-10-CM

## 2021-04-06 DIAGNOSIS — R931 Abnormal findings on diagnostic imaging of heart and coronary circulation: Secondary | ICD-10-CM | POA: Diagnosis not present

## 2021-04-06 NOTE — Progress Notes (Signed)
Cardiology Office Note:    Date:  04/06/2021   ID:  Eric Solomon, DOB April 27, 1951, MRN [id number]  PCP:  Deland Pretty, MD  Cardiologist:  None  Electrophysiologist:  None   Referring MD: Deland Pretty, MD   Chief Complaint/Reason for Referral: Elevated coronary artery calcium score  History of Present Illness:    Eric Solomon is a 70 y.o. male with a history of rheumatoid arthritis on methotrexate, osteoporosis, HLD, who presents for cardiovascular review from Dr. Shelia Media for severely elevated coronary artery calcium score.   He had a CAC score performed for risk stratification. Score is severely elevated at 3630 with multivessel coronary calcifications. Recent lipid panel with Dr. Shelia Media demonstrated LDL 114, Trig 82, HDL 43. He was started on rosuvastatin 20 mg daily and aspirin 81 mg daily.   He exercises at the [facility], using the stationary bike and walking 30 minutes at a time. The patient denies chest pain, chest pressure, dyspnea at rest or with exertion, palpitations, PND, orthopnea, or leg swelling. Denies cough, fever, chills. Denies nausea, vomiting. Denies syncope or presyncope. Denies dizziness or lightheadedness.  We discussed the natural history of coronary artery calcifications. We discussed that this does indicate coronary artery disease. Detection of coronary artery calcifications does not inform degree of coronary artery stenosis. Goal directed medical therapy is indicated for secondary prevention of CAD including in many cases low dose aspirin and moderate-high intensity statin therapy, if tolerated by the patient. We discussed risk stratification of coronary calcium score and MESA database percentile compared to age and sex-matched peers for the validated age group when coronary calcium scoring is performed. The absence of coronary artery calcifications does not exclude the possibility of coronary artery disease with non-calcified "soft" plaque. We reviewed that ischemic  testing is not required in the absence of symptoms, however further risk stratification is sometimes indicated with dense calcification in high risk locations such as left main coronary artery. We discussed indications for further ischemic workup.  He is currently asymptomatic.   Past Medical History:  Diagnosis Date   HLD (hyperlipidemia)    Osteoporosis    Rheumatoid arthritis ([name])     History reviewed. No pertinent surgical history.  Current Medications: Current Meds  Medication Sig   aspirin 81 MG chewable tablet 1 tablet   Calcium Carbonate-Vitamin D3 600-400 MG-UNIT TABS 1 tablet with food   cetirizine (ZYRTEC) 10 MG tablet 1 tablet   denosumab (PROLIA) 60 MG/ML SOSY injection 1 mL   fluticasone (FLONASE) 50 MCG/ACT nasal spray 1 spray in each nostril as needed   folic acid (FOLVITE) 947 MCG tablet 1 tablet   methotrexate (RHEUMATREX) 2.5 MG tablet methotrexate sodium 2.5 mg tablet   Multiple Vitamin (ONE DAILY MULTIVITAMIN ADULT) TABS    Omega-3 Fatty Acids (FISH OIL) 1000 MG CAPS 1 capsule   rosuvastatin (CRESTOR) 20 MG tablet rosuvastatin 20 mg tablet     Allergies:   Patient has no allergy information on record.       Family History: The patient's family history includes Stroke in his brother and father.  ROS:   Please see the history of present illness.    All other systems reviewed and are negative.  EKGs/Labs/Other Studies Reviewed:    The following studies were reviewed today:  EKG:  NSR, HR 74  Imaging studies that I have independently reviewed today: CT coronary calcium score 01/21/21 - 3 vessel CAC  Recent Labs: No results found for requested labs within last 8760 hours.  Recent Lipid Panel No results found for: CHOL, TRIG, HDL, CHOLHDL, VLDL, LDLCALC, LDLDIRECT  Physical Exam:    VS:  BP 140/73   Pulse 74   Ht 6\' 3"  (1.905 m)   Wt 196 lb 6.4 oz (89.1 kg)   SpO2 97%   BMI 24.55 kg/m     Wt Readings from Last 5 Encounters:  04/06/21 196 lb  6.4 oz (89.1 kg)    Constitutional: No acute distress Eyes: sclera non-icteric, normal conjunctiva and lids ENMT: normal dentition, moist mucous membranes Cardiovascular: regular rhythm, normal rate, no murmur. S1 and S2 normal. No jugular venous distention.  Respiratory: clear to auscultation bilaterally GI : normal bowel sounds, soft and nontender. No distention.   MSK: extremities warm, well perfused. No edema.  NEURO: grossly nonfocal exam, moves all extremities. PSYCH: alert and oriented x 3, normal mood and affect.   ASSESSMENT:    1. Elevated coronary artery calcium score   2. Hyperlipidemia, unspecified hyperlipidemia type   3. Age related osteoporosis, unspecified pathological fracture presence   4. Rheumatoid arthritis involving multiple sites, unspecified whether rheumatoid factor present ([name])   5. Pure hypercholesterolemia    PLAN:    Elevated coronary artery calcium score - Plan: EKG 12-Lead, Lipid panel Hyperlipidemia, unspecified hyperlipidemia type - Plan: EKG 12-Lead, Lipid panel - will repeat a lipid panel 3 mos from initiation of statin to evaluate effect. Though his calcium score is very high, 3630 at 97th percentile, he is asymptomatic. We discussed merits of stress testing vs observation. He would [facility] and if he has symptoms, we will pursue stress testing or CCTA.  - continue crestor 20 mg daily and ASA 81 mg daily  Age related osteoporosis, unspecified pathological fracture presence - dietary calcium intake unlikely to be affecting CAC score.   Rheumatoid arthritis involving multiple sites, unspecified whether rheumatoid factor present ([name]) - discussed the role of RA on premature atherosclerotic heart disease. Managed by PCP.   Total time of encounter: 60 minutes total time of encounter, including 45 minutes spent in face-to-face patient care on the date of this encounter. This time includes  coordination of care and counseling regarding above mentioned problem list. Remainder of non-face-to-face time involved reviewing chart documents/testing relevant to the patient encounter and documentation in the medical record. I have independently reviewed documentation from referring provider.   Cherlynn Kaiser, MD, [place]   Shared Decision Making/Informed Consent:       Medication Adjustments/Labs and Tests Ordered: Current medicines are reviewed at length with the patient today.  Concerns regarding medicines are outlined above.   Orders Placed This Encounter  Procedures   Lipid panel   EKG 12-Lead    No orders of the defined types were placed in this encounter.   Patient Instructions  Medication Instructions:  No Changes In Medications at this time.  *If you need a refill on your cardiac medications before your next appointment, please call your pharmacy*  Lab Work: [place] IN November- NO APPOINTMENT NEEDED- you will need to fast  If you have labs (blood work) drawn today and your tests are completely normal, you will receive your results only by: [place] (if you have MyChart) OR A paper copy in the mail If you have any lab test that is abnormal or we need to change your treatment, we will call you to review the results.  Follow-Up:  At [facility], you and your health needs are our priority.  As part of our continuing mission to provide you with exceptional heart care, we have created designated Provider Care Teams.  These Care Teams include your primary Cardiologist (physician) and Advanced Practice Providers (APPs -  Physician Assistants and Nurse Practitioners) who all work together to provide you with the care you need, when you need it.  Your next appointment:   November 21st at 1:00PM  The format for your next appointment:   In Person  Provider:   Cherlynn Kaiser, MD

## 2021-04-06 NOTE — Patient Instructions (Addendum)
Medication Instructions:  No Changes In Medications at this time.  *If you need a refill on your cardiac medications before your next appointment, please call your pharmacy*  Lab Work: [place] IN November- NO APPOINTMENT NEEDED- you will need to fast  If you have labs (blood work) drawn today and your tests are completely normal, you will receive your results only by: [place] (if you have MyChart) OR A paper copy in the mail If you have any lab test that is abnormal or we need to change your treatment, we will call you to review the results.  Follow-Up: At [facility], you and your health needs are our priority.  As part of our continuing mission to provide you with exceptional heart care, we have created designated Provider Care Teams.  These Care Teams include your primary Cardiologist (physician) and Advanced Practice Providers (APPs -  Physician Assistants and Nurse Practitioners) who all work together to provide you with the care you need, when you need it.  Your next appointment:   November 21st at 1:00PM  The format for your next appointment:   In Person  Provider:   Cherlynn Kaiser, MD

## 2021-04-13 DIAGNOSIS — M0589 Other rheumatoid arthritis with rheumatoid factor of multiple sites: Secondary | ICD-10-CM | POA: Diagnosis not present

## 2021-05-10 DIAGNOSIS — E785 Hyperlipidemia, unspecified: Secondary | ICD-10-CM | POA: Diagnosis not present

## 2021-05-10 DIAGNOSIS — R931 Abnormal findings on diagnostic imaging of heart and coronary circulation: Secondary | ICD-10-CM | POA: Diagnosis not present

## 2021-05-10 LAB — LIPID PANEL
Chol/HDL Ratio: 3.1 ratio (ref 0.0–5.0)
Cholesterol, Total: 130 mg/dL (ref 100–199)
HDL: 42 mg/dL (ref >39)
LDL Chol Calc (NIH): 74 mg/dL (ref 0–99)
Triglycerides: 71 mg/dL (ref 0–149)
VLDL Cholesterol Cal: 14 mg/dL (ref 5–40)

## 2021-05-13 ENCOUNTER — Encounter: Payer: Self-pay | Admitting: Internal Medicine

## 2021-05-17 ENCOUNTER — Encounter: Payer: Self-pay | Admitting: Internal Medicine

## 2021-05-17 ENCOUNTER — Ambulatory Visit (INDEPENDENT_AMBULATORY_CARE_PROVIDER_SITE_OTHER): Payer: Medicare Other | Admitting: Internal Medicine

## 2021-05-17 ENCOUNTER — Ambulatory Visit: Payer: Medicare Other | Admitting: Internal Medicine

## 2021-05-17 ENCOUNTER — Other Ambulatory Visit: Payer: Self-pay

## 2021-05-17 VITALS — BP 148/82 | HR 81 | Ht 75.0 in | Wt 195.6 lb

## 2021-05-17 DIAGNOSIS — I351 Nonrheumatic aortic (valve) insufficiency: Secondary | ICD-10-CM

## 2021-05-17 DIAGNOSIS — M810 Age-related osteoporosis without current pathological fracture: Secondary | ICD-10-CM | POA: Insufficient documentation

## 2021-05-17 DIAGNOSIS — R931 Abnormal findings on diagnostic imaging of heart and coronary circulation: Secondary | ICD-10-CM

## 2021-05-17 DIAGNOSIS — R030 Elevated blood-pressure reading, without diagnosis of hypertension: Secondary | ICD-10-CM

## 2021-05-17 DIAGNOSIS — M069 Rheumatoid arthritis, unspecified: Secondary | ICD-10-CM | POA: Insufficient documentation

## 2021-05-17 DIAGNOSIS — I340 Nonrheumatic mitral (valve) insufficiency: Secondary | ICD-10-CM

## 2021-05-17 DIAGNOSIS — E785 Hyperlipidemia, unspecified: Secondary | ICD-10-CM | POA: Insufficient documentation

## 2021-05-17 NOTE — Progress Notes (Signed)
Cardiology Office Note:    Date:  05/17/2021   ID:  Eric Solomon, DOB 1950-12-16, MRN [id number]  PCP:  Deland Pretty, MD  Cardiologist:  Elouise Munroe, MD  Electrophysiologist:  None   Referring MD: Deland Pretty, MD   Chief Complaint/Reason for Referral: CAC  History of Present Illness:    Eric Solomon is a 70 y.o. male with a history of hyperlipidemia, rheumatoid arthritis, osteoporosis, and coronary artery disease here today for follow-up. I last saw him on 04/06/21 for evaluation of elevated coronary artery calcium score. His calcium score from 01/21/21 was 3630 which is at percentile 25 for age and sex.   He has been recording his progress on his exercise bike. He is trying to get back to 150 bpm which is where he was before. He has been doing well on the bike. He is able to get his heart rate up to 140 bpm and denies exertional symptoms.  Since starting 20mg  rosuvastatin, he reports feeling aches and pains. However, he believes these pains may be due to his rheumatoid arthritis. He would prefer to continue exercising and working on his diet to control his cholesterol. For his osteoporosis, he takes calcium supplements. These supplements have been decreased from 2 to 1 tablet per day. He is curious about the injectable treatments for cholesterol. We discussed his excellent response to statins, lipid panel LDL 74, Trig 71, HDL 42 obtained 05/10/21.   He takes care of his wife with dementia at home. A CNA comes to help 5 days a week. He is worried about suddenly passing away or having to send his wife to a care facility because of his heart health. We reviewed that we will act quickly if he develops symptoms and reviewed these in detail today. Red flag symptoms reviewed.   His father passed away of stroke at 72 years old in his sleep. His older brother by 26 years passed away earlier this year at 72 years old also due to stroke.  The patient denies chest pain, chest pressure,  dyspnea at rest, PND, orthopnea, or leg swelling. Denies cough, fever, chills, nausea, or vomiting. Denies syncope, presyncope, or snoring. Denies dizziness or lightheadedness.   Past Medical History:  Diagnosis Date   HLD (hyperlipidemia)    Osteoporosis    Rheumatoid arthritis ([place])     No past surgical history on file.  Current Medications: Current Meds  Medication Sig   aspirin 81 MG chewable tablet 1 tablet   Calcium Carbonate-Vitamin D3 600-400 MG-UNIT TABS 1 tablet with food   denosumab (PROLIA) 60 MG/ML SOSY injection 60 mg every 6 (six) months.   fluticasone (FLONASE) 50 MCG/ACT nasal spray 1 spray in each nostril as needed   folic acid (FOLVITE) 637 MCG tablet Take 400 mcg by mouth in the morning and at bedtime.   methotrexate (RHEUMATREX) 2.5 MG tablet Take 2.5 mg by mouth once a week. Patient takes 3 tablets Tuesday morning   Methotrexate 2.5 MG/ML SOLN    Multiple Vitamin (ONE DAILY MULTIVITAMIN ADULT) TABS    Omega-3 Fatty Acids (FISH OIL) 1000 MG CAPS daily in the afternoon.   rosuvastatin (CRESTOR) 20 MG tablet Take 20 mg by mouth daily.     Allergies:   Patient has no allergy information on record.   Social History   Tobacco Use   Smoking status: Never   Smokeless tobacco: Never     Family History: The patient's family history includes Stroke in his brother and  father.  ROS:   Please see the history of present illness.    (+) Myalgia All other systems reviewed and are negative.  EKGs/Labs/Other Studies Reviewed:    The following studies were reviewed today: CT Cardiac Scoring 01/21/21 1. Coronary calcium score is 3630 and this is at percentile 97 for patients of the same age, gender and ethnicity. 2. Scattered tiny pulmonary nodules, some which are calcified. The calcified nodules are compatible with calcified granulomas. The other small nodules are indeterminate. No follow-up needed if patient is low-risk (and has no known or suspected  primary neoplasm). Non-contrast chest CT can be considered in 12 months if patient is high-risk. This recommendation follows the consensus statement: Guidelines for Management of Incidental Pulmonary Nodules Detected on CT Images: From the Fleischner Society 2017; Radiology 2017; 284:228-243.  Echo 02/22/21 1. Left ventricular ejection fraction, by estimation, is 60 to 65%. The  left ventricle has normal function. The left ventricle has no regional wall motion abnormalities. Left ventricular diastolic parameters are consistent with Grade I diastolic dysfunction (impaired relaxation). The average left ventricular global longitudinal strain is -25.4 %. The global longitudinal strain is normal.   2. Right ventricular systolic function is normal. The right ventricular size is normal. There is normal pulmonary artery systolic pressure.   3. The mitral valve is normal in structure. Mild mitral valve regurgitation. No evidence of mitral stenosis.   4. The aortic valve is tricuspid. There is mild calcification of the  aortic valve. There is mild thickening of the aortic valve. Aortic valve  regurgitation is mild. No aortic stenosis is present. Aortic regurgitation  PHT measures 603 msec.   5. The inferior vena cava is normal in size with greater than 50%  respiratory variability, suggesting right atrial pressure of 3 mmHg.   EKG:  EKG was not ordered today 04/06/21: NSR, HR 74  Imaging studies that I have independently reviewed today: n/a  Recent Labs: No results found for requested labs within last 8760 hours.  Recent Lipid Panel    Component Value Date/Time   CHOL 130 05/10/2021 0832   TRIG 71 05/10/2021 0832   HDL 42 05/10/2021 0832   CHOLHDL 3.1 05/10/2021 0832   LDLCALC 74 05/10/2021 0832    Physical Exam:    VS:  BP (!) 148/82 (BP Location: Left Arm, Patient Position: Sitting, Cuff Size: Normal)   Pulse 81   Ht 6\' 3"  (1.905 m)   Wt 195 lb 9.6 oz (88.7 kg)   SpO2 98%   BMI  24.45 kg/m     Wt Readings from Last 5 Encounters:  05/17/21 195 lb 9.6 oz (88.7 kg)  04/06/21 196 lb 6.4 oz (89.1 kg)    Constitutional: No acute distress Eyes: sclera non-icteric, normal conjunctiva and lids ENMT: normal dentition, moist mucous membranes Cardiovascular: regular rhythm, normal rate, no murmur. S1 and S2 normal. No jugular venous distention.  Respiratory: clear to auscultation bilaterally GI : normal bowel sounds, soft and nontender. No distention.   MSK: extremities warm, well perfused. No edema.  NEURO: grossly nonfocal exam, moves all extremities. PSYCH: alert and oriented x 3, normal mood and affect.   ASSESSMENT:    1. Hyperlipidemia, unspecified hyperlipidemia type   2. Elevated coronary artery calcium score   3. Mild mitral valve regurgitation   4. Mild aortic valve insufficiency   5. Elevated BP without diagnosis of hypertension     PLAN:    Hyperlipidemia, unspecified hyperlipidemia type - Plan: Lipid panel Elevated coronary  artery calcium score - repeat lipids in 6 mo to continue to see effect of diet and lifestyle modification, nearly at goal. Discussed increasing dose of crestor, he would like to stay at current dose and continue exercise.  -continue crestor 20 mg daily and ASA 81 mg daily.   Mild mitral valve regurgitation Mild aortic valve insufficiency - will repeat an echo in 2023 to follow  Elevated BP - two serial elevated BP readings over our visits. If elevated at follow up, consider initiation of medication therapy.   Total time of encounter: 30 minutes total time of encounter, including 25 minutes spent in face-to-face patient care on the date of this encounter. This time includes coordination of care and counseling regarding above mentioned problem list. Remainder of non-face-to-face time involved reviewing chart documents/testing relevant to the patient encounter and documentation in the medical record. I have independently reviewed  documentation from referring provider.   Cherlynn Kaiser, MD, [place]   Shared Decision Making/Informed Consent:       Medication Adjustments/Labs and Tests Ordered: Current medicines are reviewed at length with the patient today.  Concerns regarding medicines are outlined above.   Orders Placed This Encounter  Procedures   Lipid panel     No orders of the defined types were placed in this encounter.   Patient Instructions  Medication Instructions:  No Changes In Medications at this time.  *If you need a refill on your cardiac medications before your next appointment, please call your pharmacy*  Lab Work: PLEASE RETURN IN 6 MONTHS FOR LIPID PANEL BLOOD WORK- YOU WILL NEED TO FAST- NO APPOINTMENT IS NEEDED. THE LAB IS OPEN Monday-Friday FROM 8AM-4PM If you have labs (blood work) drawn today and your tests are completely normal, you will receive your results only by: [place] (if you have MyChart) OR A paper copy in the mail If you have any lab test that is abnormal or we need to change your treatment, we will call you to review the results.  Follow-Up: At [facility], you and your health needs are our priority.  As part of our continuing mission to provide you with exceptional heart care, we have created designated Provider Care Teams.  These Care Teams include your primary Cardiologist (physician) and Advanced Practice Providers (APPs -  Physician Assistants and Nurse Practitioners) who all work together to provide you with the care you need, when you need it.  Your next appointment:   6 month(s)  The format for your next appointment:   In Person  Provider:   Elouise Munroe, MD      Wilhemina Bonito as a scribe for Elouise Munroe, MD.,have documented all relevant documentation on the behalf of Elouise Munroe, MD,as directed by  Elouise Munroe, MD while in the presence of Elouise Munroe, MD.  I, Elouise Munroe, MD, have reviewed all documentation for this visit. The documentation on 05/17/21 for the exam, diagnosis, procedures, and orders are all accurate and complete.

## 2021-05-17 NOTE — Patient Instructions (Signed)
Medication Instructions:  No Changes In Medications at this time.  *If you need a refill on your cardiac medications before your next appointment, please call your pharmacy*  Lab Work: PLEASE RETURN IN 6 MONTHS FOR LIPID PANEL BLOOD WORK- YOU WILL NEED TO FAST- NO APPOINTMENT IS NEEDED. THE LAB IS OPEN Monday-Friday FROM 8AM-4PM If you have labs (blood work) drawn today and your tests are completely normal, you will receive your results only by: [place] (if you have MyChart) OR A paper copy in the mail If you have any lab test that is abnormal or we need to change your treatment, we will call you to review the results.  Follow-Up: At [facility], you and your health needs are our priority.  As part of our continuing mission to provide you with exceptional heart care, we have created designated Provider Care Teams.  These Care Teams include your primary Cardiologist (physician) and Advanced Practice Providers (APPs -  Physician Assistants and Nurse Practitioners) who all work together to provide you with the care you need, when you need it.  Your next appointment:   6 month(s)  The format for your next appointment:   In Person  Provider:   Elouise Munroe, MD

## 2021-05-24 ENCOUNTER — Ambulatory Visit: Payer: Medicare Other | Admitting: Internal Medicine

## 2021-07-27 DIAGNOSIS — Z79899 Other long term (current) drug therapy: Secondary | ICD-10-CM | POA: Diagnosis not present

## 2021-07-27 DIAGNOSIS — M0589 Other rheumatoid arthritis with rheumatoid factor of multiple sites: Secondary | ICD-10-CM | POA: Diagnosis not present

## 2021-07-27 DIAGNOSIS — M25512 Pain in left shoulder: Secondary | ICD-10-CM | POA: Diagnosis not present

## 2021-07-27 DIAGNOSIS — Z8546 Personal history of malignant neoplasm of prostate: Secondary | ICD-10-CM | POA: Diagnosis not present

## 2021-07-27 DIAGNOSIS — M8580 Other specified disorders of bone density and structure, unspecified site: Secondary | ICD-10-CM | POA: Diagnosis not present

## 2021-07-27 DIAGNOSIS — M5459 Other low back pain: Secondary | ICD-10-CM | POA: Diagnosis not present

## 2021-09-09 DIAGNOSIS — M810 Age-related osteoporosis without current pathological fracture: Secondary | ICD-10-CM | POA: Diagnosis not present

## 2021-11-15 ENCOUNTER — Other Ambulatory Visit: Payer: Self-pay

## 2021-11-15 DIAGNOSIS — E785 Hyperlipidemia, unspecified: Secondary | ICD-10-CM

## 2021-11-15 LAB — LIPID PANEL
Chol/HDL Ratio: 2.5 ratio (ref 0.0–5.0)
Cholesterol, Total: 124 mg/dL (ref 100–199)
HDL: 49 mg/dL (ref >39)
LDL Chol Calc (NIH): 62 mg/dL (ref 0–99)
Triglycerides: 57 mg/dL (ref 0–149)
VLDL Cholesterol Cal: 13 mg/dL (ref 5–40)

## 2021-11-17 ENCOUNTER — Ambulatory Visit (INDEPENDENT_AMBULATORY_CARE_PROVIDER_SITE_OTHER): Payer: Medicare Other | Admitting: Internal Medicine

## 2021-11-17 ENCOUNTER — Encounter: Payer: Self-pay | Admitting: Internal Medicine

## 2021-11-17 VITALS — BP 134/76 | HR 73 | Ht 75.0 in | Wt 188.6 lb

## 2021-11-17 DIAGNOSIS — M069 Rheumatoid arthritis, unspecified: Secondary | ICD-10-CM

## 2021-11-17 DIAGNOSIS — I351 Nonrheumatic aortic (valve) insufficiency: Secondary | ICD-10-CM

## 2021-11-17 DIAGNOSIS — I340 Nonrheumatic mitral (valve) insufficiency: Secondary | ICD-10-CM | POA: Diagnosis not present

## 2021-11-17 DIAGNOSIS — R030 Elevated blood-pressure reading, without diagnosis of hypertension: Secondary | ICD-10-CM | POA: Diagnosis not present

## 2021-11-17 DIAGNOSIS — E785 Hyperlipidemia, unspecified: Secondary | ICD-10-CM

## 2021-11-17 DIAGNOSIS — R931 Abnormal findings on diagnostic imaging of heart and coronary circulation: Secondary | ICD-10-CM

## 2021-11-17 DIAGNOSIS — Z79899 Other long term (current) drug therapy: Secondary | ICD-10-CM | POA: Diagnosis not present

## 2021-11-17 NOTE — Patient Instructions (Signed)
.  Medication Instructions:  INCREASE CRESTOR TO 1 and 1/2 TABLETS ('30mg'$ )- LET us KNOW HOW IT IS GOING IN 2 WEEKS- CAN SEND A MYCHART MESSAGE OR CALL *If you need a refill on your cardiac medications before your next appointment, please call your pharmacy*  Follow-Up: At [facility], you and your health needs are our priority.  As part of our continuing mission to provide you with exceptional heart care, we have created designated Provider Care Teams.  These Care Teams include your primary Cardiologist (physician) and Advanced Practice Providers (APPs -  Physician Assistants and Nurse Practitioners) who all work together to provide you with the care you need, when you need it.  Your next appointment:   6 month(s)  The format for your next appointment:   In Person  Provider:   Elouise Munroe, MD

## 2021-11-17 NOTE — Progress Notes (Unsigned)
Cardiology Office Note:    Date:  11/17/2021   ID:  Eric Solomon, DOB Oct 18, 1950, MRN [id number]  PCP:  Deland Pretty, MD  Cardiologist:  Elouise Munroe, MD  Electrophysiologist:  None   Referring MD: Deland Pretty, MD   Chief Complaint/Reason for Referral: CAC  History of Present Illness:    Eric Solomon is a 71 y.o. male with a history of hyperlipidemia, rheumatoid arthritis, osteoporosis, and coronary artery disease here today for follow-up. I last saw him on 04/06/21 for evaluation of elevated coronary artery calcium score. His calcium score from 01/21/21 was 3630 which is at percentile 22 for age and sex.   He has been recording his progress on his exercise bike. He is trying to get back to 150 bpm which is where he was before. He has been doing well on the bike. He is able to get his heart rate up to 140 bpm and denies exertional symptoms.  Since starting '20mg'$  rosuvastatin, he reports feeling aches and pains. However, he believes these pains may be due to his rheumatoid arthritis. He would prefer to continue exercising and working on his diet to control his cholesterol. For his osteoporosis, he takes calcium supplements. These supplements have been decreased from 2 to 1 tablet per day. He is curious about the injectable treatments for cholesterol. We discussed his excellent response to statins, lipid panel LDL 74, Trig 71, HDL 42 obtained 05/10/21.   He takes care of his wife with dementia at home. A CNA comes to help 5 days a week. He is worried about suddenly passing away or having to send his wife to a care facility because of his heart health. We reviewed that we will act quickly if he develops symptoms and reviewed these in detail today. Red flag symptoms reviewed.   His father passed away of stroke at 44 years old in his sleep. His older brother by 19 years passed away earlier this year at 73 years old also due to stroke.  The patient denies chest pain, chest pressure,  dyspnea at rest, PND, orthopnea, or leg swelling. Denies cough, fever, chills, nausea, or vomiting. Denies syncope, presyncope, or snoring. Denies dizziness or lightheadedness.   Past Medical History:  Diagnosis Date   HLD (hyperlipidemia)    Osteoporosis    Rheumatoid arthritis ([place])     No past surgical history on file.  Current Medications: Current Meds  Medication Sig   aspirin 81 MG chewable tablet 1 tablet   Calcium Carbonate-Vitamin D3 600-400 MG-UNIT TABS 1 tablet with food   cetirizine (ZYRTEC) 10 MG tablet    denosumab (PROLIA) 60 MG/ML SOSY injection 60 mg every 6 (six) months.   fluticasone (FLONASE) 50 MCG/ACT nasal spray 1 spray in each nostril as needed   folic acid (FOLVITE) 850 MCG tablet Take 400 mcg by mouth in the morning and at bedtime.   methotrexate (RHEUMATREX) 2.5 MG tablet Take 2.5 mg by mouth once a week. Patient takes 3 tablets Tuesday morning   Methotrexate 2.5 MG/ML SOLN    Multiple Vitamin (ONE DAILY MULTIVITAMIN ADULT) TABS    Omega-3 Fatty Acids (FISH OIL) 1000 MG CAPS daily in the afternoon.   rosuvastatin (CRESTOR) 20 MG tablet Take 20 mg by mouth daily.     Allergies:   Patient has no allergy information on record.   Social History   Tobacco Use   Smoking status: Never   Smokeless tobacco: Never     Family History: The patient's  family history includes Stroke in his brother and father.  ROS:   Please see the history of present illness.    (+) Myalgia All other systems reviewed and are negative.  EKGs/Labs/Other Studies Reviewed:    The following studies were reviewed today: CT Cardiac Scoring 01/21/21 1. Coronary calcium score is 3630 and this is at percentile 97 for patients of the same age, gender and ethnicity. 2. Scattered tiny pulmonary nodules, some which are calcified. The calcified nodules are compatible with calcified granulomas. The other small nodules are indeterminate. No follow-up needed if patient is low-risk (and  has no known or suspected primary neoplasm). Non-contrast chest CT can be considered in 12 months if patient is high-risk. This recommendation follows the consensus statement: Guidelines for Management of Incidental Pulmonary Nodules Detected on CT Images: From the Fleischner Society 2017; Radiology 2017; 284:228-243.  Echo 02/22/21 1. Left ventricular ejection fraction, by estimation, is 60 to 65%. The  left ventricle has normal function. The left ventricle has no regional wall motion abnormalities. Left ventricular diastolic parameters are consistent with Grade I diastolic dysfunction (impaired relaxation). The average left ventricular global longitudinal strain is -25.4 %. The global longitudinal strain is normal.   2. Right ventricular systolic function is normal. The right ventricular size is normal. There is normal pulmonary artery systolic pressure.   3. The mitral valve is normal in structure. Mild mitral valve regurgitation. No evidence of mitral stenosis.   4. The aortic valve is tricuspid. There is mild calcification of the  aortic valve. There is mild thickening of the aortic valve. Aortic valve  regurgitation is mild. No aortic stenosis is present. Aortic regurgitation  PHT measures 603 msec.   5. The inferior vena cava is normal in size with greater than 50%  respiratory variability, suggesting right atrial pressure of 3 mmHg.   EKG:  NSR 04/06/21: NSR, HR 74  Imaging studies that I have independently reviewed today: n/a  Recent Labs: No results found for requested labs within last 8760 hours.  Recent Lipid Panel    Component Value Date/Time   CHOL 124 11/15/2021 1104   TRIG 57 11/15/2021 1104   HDL 49 11/15/2021 1104   CHOLHDL 2.5 11/15/2021 1104   LDLCALC 62 11/15/2021 1104    Physical Exam:    VS:  BP 134/76   Pulse 73   Ht '6\' 3"'$  (1.905 m)   Wt 188 lb 9.6 oz (85.5 kg)   SpO2 96%   BMI 23.57 kg/m     Wt Readings from Last 5 Encounters:  11/17/21 188 lb  9.6 oz (85.5 kg)  05/17/21 195 lb 9.6 oz (88.7 kg)  04/06/21 196 lb 6.4 oz (89.1 kg)    Constitutional: No acute distress Eyes: sclera non-icteric, normal conjunctiva and lids ENMT: normal dentition, moist mucous membranes Cardiovascular: regular rhythm, normal rate, no murmur. S1 and S2 normal. No jugular venous distention.  Respiratory: clear to auscultation bilaterally GI : normal bowel sounds, soft and nontender. No distention.   MSK: extremities warm, well perfused. No edema.  NEURO: grossly nonfocal exam, moves all extremities. PSYCH: alert and oriented x 3, normal mood and affect.   ASSESSMENT:    No diagnosis found.   PLAN:    Hyperlipidemia, unspecified hyperlipidemia type - Plan: Lipid panel Elevated coronary artery calcium score - repeat lipids in 6 mo to continue to see effect of diet and lifestyle modification, nearly at goal. Discussed increasing dose of crestor, he would like to stay at current  dose and continue exercise.  -continue crestor 20 mg daily and ASA 81 mg daily.   Mild mitral valve regurgitation Mild aortic valve insufficiency - will repeat an echo in 2023 to follow  Elevated BP - two serial elevated BP readings over our visits. If elevated at follow up, consider initiation of medication therapy.   Total time of encounter: 30 minutes total time of encounter, including 25 minutes spent in face-to-face patient care on the date of this encounter. This time includes coordination of care and counseling regarding above mentioned problem list. Remainder of non-face-to-face time involved reviewing chart documents/testing relevant to the patient encounter and documentation in the medical record. I have independently reviewed documentation from referring provider.   Cherlynn Kaiser, MD, [place]   Shared Decision Making/Informed Consent:       Medication Adjustments/Labs and Tests Ordered: Current medicines are reviewed at length  with the patient today.  Concerns regarding medicines are outlined above.   No orders of the defined types were placed in this encounter.    No orders of the defined types were placed in this encounter.    There are no Patient Instructions on file for this visit.

## 2021-12-01 ENCOUNTER — Encounter: Payer: Self-pay | Admitting: Internal Medicine

## 2021-12-09 DIAGNOSIS — I2510 Atherosclerotic heart disease of native coronary artery without angina pectoris: Secondary | ICD-10-CM | POA: Diagnosis not present

## 2021-12-09 DIAGNOSIS — I2584 Coronary atherosclerosis due to calcified coronary lesion: Secondary | ICD-10-CM | POA: Diagnosis not present

## 2021-12-09 DIAGNOSIS — Z125 Encounter for screening for malignant neoplasm of prostate: Secondary | ICD-10-CM | POA: Diagnosis not present

## 2021-12-09 DIAGNOSIS — E7800 Pure hypercholesterolemia, unspecified: Secondary | ICD-10-CM | POA: Diagnosis not present

## 2021-12-10 DIAGNOSIS — I872 Venous insufficiency (chronic) (peripheral): Secondary | ICD-10-CM | POA: Diagnosis not present

## 2021-12-10 DIAGNOSIS — I83009 Varicose veins of unspecified lower extremity with ulcer of unspecified site: Secondary | ICD-10-CM | POA: Diagnosis not present

## 2021-12-14 ENCOUNTER — Other Ambulatory Visit: Payer: Self-pay | Admitting: Internal Medicine

## 2021-12-14 ENCOUNTER — Encounter: Payer: Self-pay | Admitting: Internal Medicine

## 2021-12-14 DIAGNOSIS — M810 Age-related osteoporosis without current pathological fracture: Secondary | ICD-10-CM | POA: Diagnosis not present

## 2021-12-14 DIAGNOSIS — Z0000 Encounter for general adult medical examination without abnormal findings: Secondary | ICD-10-CM | POA: Diagnosis not present

## 2021-12-14 DIAGNOSIS — M05751 Rheumatoid arthritis with rheumatoid factor of right hip without organ or systems involvement: Secondary | ICD-10-CM | POA: Diagnosis not present

## 2021-12-14 DIAGNOSIS — J309 Allergic rhinitis, unspecified: Secondary | ICD-10-CM | POA: Diagnosis not present

## 2021-12-14 DIAGNOSIS — R3129 Other microscopic hematuria: Secondary | ICD-10-CM | POA: Diagnosis not present

## 2021-12-14 DIAGNOSIS — Z8546 Personal history of malignant neoplasm of prostate: Secondary | ICD-10-CM | POA: Diagnosis not present

## 2021-12-14 DIAGNOSIS — R918 Other nonspecific abnormal finding of lung field: Secondary | ICD-10-CM

## 2021-12-14 DIAGNOSIS — I2510 Atherosclerotic heart disease of native coronary artery without angina pectoris: Secondary | ICD-10-CM | POA: Diagnosis not present

## 2021-12-27 DIAGNOSIS — Z79899 Other long term (current) drug therapy: Secondary | ICD-10-CM | POA: Diagnosis not present

## 2021-12-27 DIAGNOSIS — M5459 Other low back pain: Secondary | ICD-10-CM | POA: Diagnosis not present

## 2021-12-27 DIAGNOSIS — M25512 Pain in left shoulder: Secondary | ICD-10-CM | POA: Diagnosis not present

## 2021-12-27 DIAGNOSIS — M0589 Other rheumatoid arthritis with rheumatoid factor of multiple sites: Secondary | ICD-10-CM | POA: Diagnosis not present

## 2021-12-27 DIAGNOSIS — M8580 Other specified disorders of bone density and structure, unspecified site: Secondary | ICD-10-CM | POA: Diagnosis not present

## 2021-12-27 DIAGNOSIS — Z8546 Personal history of malignant neoplasm of prostate: Secondary | ICD-10-CM | POA: Diagnosis not present

## 2021-12-29 ENCOUNTER — Ambulatory Visit
Admission: RE | Admit: 2021-12-29 | Discharge: 2021-12-29 | Disposition: A | Discharge status: Home / Self Care | Payer: Medicare Other | Source: Ambulatory Visit | Attending: Internal Medicine | Admitting: Internal Medicine

## 2021-12-29 DIAGNOSIS — R918 Other nonspecific abnormal finding of lung field: Secondary | ICD-10-CM | POA: Diagnosis not present

## 2021-12-29 DIAGNOSIS — R911 Solitary pulmonary nodule: Secondary | ICD-10-CM | POA: Diagnosis not present

## 2021-12-29 DIAGNOSIS — I700 Atherosclerosis of aorta: Secondary | ICD-10-CM | POA: Diagnosis not present

## 2021-12-30 DIAGNOSIS — C61 Malignant neoplasm of prostate: Secondary | ICD-10-CM | POA: Diagnosis not present

## 2021-12-30 DIAGNOSIS — R3121 Asymptomatic microscopic hematuria: Secondary | ICD-10-CM | POA: Diagnosis not present

## 2021-12-31 DIAGNOSIS — I83009 Varicose veins of unspecified lower extremity with ulcer of unspecified site: Secondary | ICD-10-CM | POA: Diagnosis not present

## 2021-12-31 DIAGNOSIS — I872 Venous insufficiency (chronic) (peripheral): Secondary | ICD-10-CM | POA: Diagnosis not present

## 2022-01-10 DIAGNOSIS — K3189 Other diseases of stomach and duodenum: Secondary | ICD-10-CM | POA: Diagnosis not present

## 2022-01-10 DIAGNOSIS — R3121 Asymptomatic microscopic hematuria: Secondary | ICD-10-CM | POA: Diagnosis not present

## 2022-01-10 DIAGNOSIS — N2889 Other specified disorders of kidney and ureter: Secondary | ICD-10-CM | POA: Diagnosis not present

## 2022-01-10 DIAGNOSIS — N281 Cyst of kidney, acquired: Secondary | ICD-10-CM | POA: Diagnosis not present

## 2022-01-17 DIAGNOSIS — R3121 Asymptomatic microscopic hematuria: Secondary | ICD-10-CM | POA: Diagnosis not present

## 2022-03-14 DIAGNOSIS — M810 Age-related osteoporosis without current pathological fracture: Secondary | ICD-10-CM | POA: Diagnosis not present

## 2022-03-15 DIAGNOSIS — Z23 Encounter for immunization: Secondary | ICD-10-CM | POA: Diagnosis not present

## 2022-03-29 DIAGNOSIS — M0589 Other rheumatoid arthritis with rheumatoid factor of multiple sites: Secondary | ICD-10-CM | POA: Diagnosis not present

## 2022-06-10 ENCOUNTER — Ambulatory Visit: Payer: Medicare Other | Admitting: Internal Medicine

## 2022-06-29 DIAGNOSIS — H524 Presbyopia: Secondary | ICD-10-CM | POA: Diagnosis not present

## 2022-06-29 DIAGNOSIS — H52221 Regular astigmatism, right eye: Secondary | ICD-10-CM | POA: Diagnosis not present

## 2022-06-29 DIAGNOSIS — H40013 Open angle with borderline findings, low risk, bilateral: Secondary | ICD-10-CM | POA: Diagnosis not present

## 2022-06-29 DIAGNOSIS — H53143 Visual discomfort, bilateral: Secondary | ICD-10-CM | POA: Diagnosis not present

## 2022-06-29 DIAGNOSIS — H2513 Age-related nuclear cataract, bilateral: Secondary | ICD-10-CM | POA: Diagnosis not present

## 2022-06-29 DIAGNOSIS — H40053 Ocular hypertension, bilateral: Secondary | ICD-10-CM | POA: Diagnosis not present

## 2022-06-29 DIAGNOSIS — H5203 Hypermetropia, bilateral: Secondary | ICD-10-CM | POA: Diagnosis not present

## 2022-07-06 DIAGNOSIS — M5459 Other low back pain: Secondary | ICD-10-CM | POA: Diagnosis not present

## 2022-07-06 DIAGNOSIS — M8580 Other specified disorders of bone density and structure, unspecified site: Secondary | ICD-10-CM | POA: Diagnosis not present

## 2022-07-06 DIAGNOSIS — M0589 Other rheumatoid arthritis with rheumatoid factor of multiple sites: Secondary | ICD-10-CM | POA: Diagnosis not present

## 2022-07-06 DIAGNOSIS — Z79899 Other long term (current) drug therapy: Secondary | ICD-10-CM | POA: Diagnosis not present

## 2022-07-06 DIAGNOSIS — Z8546 Personal history of malignant neoplasm of prostate: Secondary | ICD-10-CM | POA: Diagnosis not present

## 2022-07-06 DIAGNOSIS — M25512 Pain in left shoulder: Secondary | ICD-10-CM | POA: Diagnosis not present

## 2022-07-13 DIAGNOSIS — Z79899 Other long term (current) drug therapy: Secondary | ICD-10-CM | POA: Diagnosis not present

## 2022-07-13 DIAGNOSIS — M810 Age-related osteoporosis without current pathological fracture: Secondary | ICD-10-CM | POA: Diagnosis not present

## 2022-07-15 ENCOUNTER — Encounter: Payer: Self-pay | Admitting: Internal Medicine

## 2022-07-20 ENCOUNTER — Ambulatory Visit: Payer: Medicare Other | Attending: Internal Medicine | Admitting: Internal Medicine

## 2022-07-20 VITALS — BP 136/80 | HR 68 | Ht 75.0 in | Wt 186.6 lb

## 2022-07-20 DIAGNOSIS — M069 Rheumatoid arthritis, unspecified: Secondary | ICD-10-CM | POA: Diagnosis not present

## 2022-07-20 DIAGNOSIS — I340 Nonrheumatic mitral (valve) insufficiency: Secondary | ICD-10-CM | POA: Diagnosis not present

## 2022-07-20 DIAGNOSIS — I351 Nonrheumatic aortic (valve) insufficiency: Secondary | ICD-10-CM

## 2022-07-20 DIAGNOSIS — R030 Elevated blood-pressure reading, without diagnosis of hypertension: Secondary | ICD-10-CM

## 2022-07-20 DIAGNOSIS — Z79899 Other long term (current) drug therapy: Secondary | ICD-10-CM

## 2022-07-20 DIAGNOSIS — E785 Hyperlipidemia, unspecified: Secondary | ICD-10-CM

## 2022-07-20 DIAGNOSIS — R931 Abnormal findings on diagnostic imaging of heart and coronary circulation: Secondary | ICD-10-CM | POA: Diagnosis not present

## 2022-07-20 MED ORDER — AMLODIPINE BESYLATE 5 MG PO TABS: 5.0000 mg | ORAL_TABLET | Freq: Every day | ORAL | 3 refills | Status: DC

## 2022-07-20 NOTE — Progress Notes (Signed)
Cardiology Office Note:    Date:  07/20/2022  ID:  DRAVYN SEVERS, DOB [birth year], MRN [id number]  PCP:  Deland Pretty, MD  Cardiologist:  Elouise Munroe, MD  Electrophysiologist:  None   Referring MD: Deland Pretty, MD   Chief Complaint/Reason for Referral: CAC  History of Present Illness:    Eric Solomon is a 72 y.o. male with a history of hyperlipidemia, rheumatoid arthritis, osteoporosis, and coronary artery disease here today for follow-up. His calcium score from 01/21/21 was 3630 which is at percentile 63 for age and sex.   At his last visit on 11/17/2021, he was doing well and tolerating statin therapy. We discussed intensifying therapy to max tolerated dose. He continued to exercise on bike, no symptoms.   Today, he reports he has been having life stressors related to his wife having worsening dementia. He otherwise has been feeling well overall.  He has been trying to manage his blood pressure. About 2 weeks ago he had an appointment with rheumatology where his blood pressure was 135/80. In clinic today it was 136/80. We discussed medications that may help him manage his hypertension.   His rheumatologist monitors his creatinine and BUN levels. He reports that his BUN was outside the higher range and he was encouraged to drink more water. He takes Methotrexate 2.5 mg/ml.   We discussed his EKG with no significant findings or new changes.  He has been monitoring and managing his cholesterol levels with Crestor 40 mg. His cholesterol levels from recent labs were within normal limits.    He has not been able to go to the [facility] as frequently as he would like due to life stressors.  He denies any palpitations, chest pain, shortness of breath, or peripheral edema. No lightheadedness, headaches, syncope, orthopnea, or PND.  He reports his father passed at 73 y/o from a stroke and his brother at age 96 y/o also had a stroke.   Past Medical History:  Diagnosis Date   HLD  (hyperlipidemia)    Osteoporosis    Rheumatoid arthritis (Onset)     No past surgical history on file.  Current Medications: Current Meds  Medication Sig   aspirin 81 MG chewable tablet 1 tablet   Calcium Carbonate-Vitamin D3 600-400 MG-UNIT TABS 1 tablet with food   cetirizine (ZYRTEC) 10 MG tablet    denosumab (PROLIA) 60 MG/ML SOSY injection 60 mg every 6 (six) months.   fluticasone (FLONASE) 50 MCG/ACT nasal spray 1 spray in each nostril as needed   folic acid (FOLVITE) 621 MCG tablet Take 400 mcg by mouth in the morning and at bedtime.   methotrexate (RHEUMATREX) 2.5 MG tablet Take 2.5 mg by mouth once a week. Patient takes 3 tablets Tuesday morning   Multiple Vitamin (ONE DAILY MULTIVITAMIN ADULT) TABS    Omega-3 Fatty Acids (FISH OIL) 1000 MG CAPS daily in the afternoon.   rosuvastatin (CRESTOR) 40 MG tablet Take 40 mg by mouth daily.     Allergies:   Patient has no known allergies.   Social History   Tobacco Use   Smoking status: Never   Smokeless tobacco: Never     Family History: The patient's family history includes Stroke in his brother and father.  ROS:   Please see the history of present illness.    All other systems reviewed and are negative.  EKGs/Labs/Other Studies Reviewed:    The following studies were reviewed today:  CT Cardiac Scoring 01/21/21 1. Coronary calcium score is  60 and this is at percentile 27 for patients of the same age, gender and ethnicity. 2. Scattered tiny pulmonary nodules, some which are calcified. The calcified nodules are compatible with calcified granulomas. The other small nodules are indeterminate. No follow-up needed if patient is low-risk (and has no known or suspected primary neoplasm). Non-contrast chest CT can be considered in 12 months if patient is high-risk. This recommendation follows the consensus statement: Guidelines for Management of Incidental Pulmonary Nodules Detected on CT Images: From the Fleischner  Society 2017; Radiology 2017; 284:228-243.  Echo 02/22/21 1. Left ventricular ejection fraction, by estimation, is 60 to 65%. The  left ventricle has normal function. The left ventricle has no regional wall motion abnormalities. Left ventricular diastolic parameters are consistent with Grade I diastolic dysfunction (impaired relaxation). The average left ventricular global longitudinal strain is -25.4 %. The global longitudinal strain is normal.   2. Right ventricular systolic function is normal. The right ventricular size is normal. There is normal pulmonary artery systolic pressure.   3. The mitral valve is normal in structure. Mild mitral valve regurgitation. No evidence of mitral stenosis.   4. The aortic valve is tricuspid. There is mild calcification of the  aortic valve. There is mild thickening of the aortic valve. Aortic valve  regurgitation is mild. No aortic stenosis is present. Aortic regurgitation  PHT measures 603 msec.   5. The inferior vena cava is normal in size with greater than 50%  respiratory variability, suggesting right atrial pressure of 3 mmHg.   Chest CT 12/29/2021: IMPRESSION: 1. Stable 3 mm noncalcified anterior left lower lobe lung nodule. No follow-up needed if patient is low-risk.This recommendation follows the consensus statement: Guidelines for Management of Incidental Pulmonary Nodules Detected on CT Images: From the Fleischner Society 2017; Radiology 2017; 284:228-243. 2. Marked severity coronary artery calcification.  EKG:  EKG is personally reviewed. 07/20/2022: Sinus rhythm. Rate 68 bpm. 11/17/2021: NSR 04/06/21: NSR, HR 74  Imaging studies that I have independently reviewed today: n/a  Recent Labs: No results found for requested labs within last 365 days.  Recent Lipid Panel    Component Value Date/Time   CHOL 124 11/15/2021 1104   TRIG 57 11/15/2021 1104   HDL 49 11/15/2021 1104   CHOLHDL 2.5 11/15/2021 1104   LDLCALC 62 11/15/2021 1104     Physical Exam:    VS:  BP 136/80   Pulse 68   Ht '6\' 3"'$  (1.905 m)   Wt 186 lb 9.6 oz (84.6 kg)   SpO2 99%   BMI 23.32 kg/m     Wt Readings from Last 5 Encounters:  07/20/22 186 lb 9.6 oz (84.6 kg)  11/17/21 188 lb 9.6 oz (85.5 kg)  05/17/21 195 lb 9.6 oz (88.7 kg)  04/06/21 196 lb 6.4 oz (89.1 kg)    Constitutional: No acute distress Eyes: sclera non-icteric, normal conjunctiva and lids ENMT: normal dentition, moist mucous membranes Cardiovascular:  regular rhythm, normal rate, no murmur. S1 and S2 normal. No jugular venous distention.  Respiratory: clear to auscultation bilaterally GI : normal bowel sounds, soft and nontender. No distention.   MSK: extremities warm, well perfused. No edema.  NEURO: grossly nonfocal exam, moves all extremities. PSYCH: alert and oriented x 3, normal mood and affect.   ASSESSMENT:    1. Elevated coronary artery calcium score   2. Nonrheumatic aortic valve insufficiency   3. Hyperlipidemia, unspecified hyperlipidemia type   4. Mild mitral valve regurgitation   5. Elevated BP without diagnosis  of hypertension   6. Rheumatoid arthritis involving multiple sites, unspecified whether rheumatoid factor present (Post)   7. Medication management    PLAN:    Hyperlipidemia, unspecified hyperlipidemia type - Elevated coronary artery calcium score - crestor 40 mg daily, tolerating well. Lipids optimized 11/15/21. -continue ASA 81 mg daily, shared decision making.  - offered stress testing/CCTA for severely elevated CAC score. He is asymptomatic and exercising well. We agreed ok to defer at this time.  Mild mitral valve regurgitation Mild aortic valve insufficiency - review at follow up, could repeat echo prior to next visit. No significant change on exam.  Elevated BP - two serial elevated BP readings over our visits. Agreed to start amlodipine 5 mg daily. Will review BP log at close follow up.   Total time of encounter: 40 minutes total time  of encounter, including 35 minutes spent in face-to-face patient care on the date of this encounter. This time includes coordination of care and counseling regarding above mentioned problem list. Remainder of non-face-to-face time involved reviewing chart documents/testing relevant to the patient encounter and documentation in the medical record. I have independently reviewed documentation from referring provider.   Cherlynn Kaiser, MD, [place]   Shared Decision Making/Informed Consent:       Medication Adjustments/Labs and Tests Ordered: Current medicines are reviewed at length with the patient today.  Concerns regarding medicines are outlined above.   Orders Placed This Encounter  Procedures   EKG 12-Lead   Meds ordered this encounter  Medications   amLODipine (NORVASC) 5 MG tablet    Sig: Take 1 tablet (5 mg total) by mouth daily.    Dispense:  90 tablet    Refill:  3   Patient Instructions  Medication Instructions:  Amlodipine 5 mg daily *If you need a refill on your cardiac medications before your next appointment, please call your pharmacy*   Lab Work: None If you have labs (blood work) drawn today and your tests are completely normal, you will receive your results only by: [place] (if you have MyChart) OR A paper copy in the mail If you have any lab test that is abnormal or we need to change your treatment, we will call you to review the results.   Testing/Procedures: None   Follow-Up: At [facility], you and your health needs are our priority.  As part of our continuing mission to provide you with exceptional heart care, we have created designated Provider Care Teams.  These Care Teams include your primary Cardiologist (physician) and Advanced Practice Providers (APPs -  Physician Assistants and Nurse Practitioners) who all work together to provide you with the care you need, when you need it.  We recommend signing up for  the patient portal called "MyChart".  Sign up information is provided on this After Visit Summary.  MyChart is used to connect with patients for Virtual Visits (Telemedicine).  Patients are able to view lab/test results, encounter notes, upcoming appointments, etc.  Non-urgent messages can be sent to your provider as well.   To learn more about what you can do with MyChart, go to [email or web address].    Your next appointment:   3 month(s)  Provider:   Elouise Munroe, MD       I,Rachel Rivera,acting as a scribe for Elouise Munroe, MD.,have documented all relevant documentation on the behalf of Elouise Munroe, MD,as directed by  Elouise Munroe, MD while in the presence of [facility]  Stann Mainland, MD.  I, Elouise Munroe, MD, have reviewed all documentation for the visit on 07/20/2022. The documentation on today's date of service for the exam, diagnosis, procedures, and orders are all accurate and complete.

## 2022-07-20 NOTE — Patient Instructions (Signed)
Medication Instructions:  Amlodipine 5 mg daily *If you need a refill on your cardiac medications before your next appointment, please call your pharmacy*   Lab Work: None If you have labs (blood work) drawn today and your tests are completely normal, you will receive your results only by: [place] (if you have MyChart) OR A paper copy in the mail If you have any lab test that is abnormal or we need to change your treatment, we will call you to review the results.   Testing/Procedures: None   Follow-Up: At [facility], you and your health needs are our priority.  As part of our continuing mission to provide you with exceptional heart care, we have created designated Provider Care Teams.  These Care Teams include your primary Cardiologist (physician) and Advanced Practice Providers (APPs -  Physician Assistants and Nurse Practitioners) who all work together to provide you with the care you need, when you need it.  We recommend signing up for the patient portal called "MyChart".  Sign up information is provided on this After Visit Summary.  MyChart is used to connect with patients for Virtual Visits (Telemedicine).  Patients are able to view lab/test results, encounter notes, upcoming appointments, etc.  Non-urgent messages can be sent to your provider as well.   To learn more about what you can do with MyChart, go to [email or web address].    Your next appointment:   3 month(s)  Provider:   Elouise Munroe, MD

## 2022-08-02 ENCOUNTER — Encounter: Payer: Self-pay | Admitting: Internal Medicine

## 2022-09-13 DIAGNOSIS — M810 Age-related osteoporosis without current pathological fracture: Secondary | ICD-10-CM | POA: Diagnosis not present

## 2022-10-04 DIAGNOSIS — H40019 Open angle with borderline findings, low risk, unspecified eye: Secondary | ICD-10-CM | POA: Diagnosis not present

## 2022-10-04 DIAGNOSIS — H40053 Ocular hypertension, bilateral: Secondary | ICD-10-CM | POA: Diagnosis not present

## 2022-10-04 DIAGNOSIS — H40013 Open angle with borderline findings, low risk, bilateral: Secondary | ICD-10-CM | POA: Diagnosis not present

## 2022-10-19 NOTE — Progress Notes (Signed)
Cardiology Office Note:    Date:  10/20/2022  ID:  Eric Solomon, DOB 1950/11/11, MRN [id number]  PCP:  Merri Brunette, MD  Cardiologist:  Parke Poisson, MD  Electrophysiologist:  None   Referring MD: Merri Brunette, MD   Chief Complaint/Reason for Referral: CAC  History of Present Illness:    Eric Solomon is a 72 y.o. male with a history of hyperlipidemia, rheumatoid arthritis, osteoporosis, and coronary artery disease here today for follow-up. His calcium score from 01/21/21 was 3630 which is at percentile 70 for age and sex.   At a prior visit on 11/17/2021, he was doing well and tolerating statin therapy. We discussed intensifying therapy to max tolerated dose. He continued to exercise on bike, no symptoms.   At his last visit, he reported life stressors related to his wife having worsening dementia. He otherwise was feeling well overall. He was trying to manage his blood pressure. About 2 weeks prior, he had an appointment with rheumatology where his blood pressure was 135/80. In clinic during this visit it was 136/80. We discussed medications that may help him manage his hypertension.   His rheumatologist monitors his creatinine and BUN levels. He reported that his BUN was outside the higher range and he was encouraged to drink more water. He took Methotrexate 2.5 mg/ml.   We discussed his EKG with no significant findings or new changes.  He had been monitoring and managing his cholesterol levels with Crestor 40 mg. His cholesterol levels from labs at the time were within normal limits.    He had not been able to go to the [facility] as frequently as he would like due to life stressors.  He reports his father passed at 27 y/o from a stroke and his brother at age 21 y/o also had a stroke.    Today, ***   He denies any palpitations, chest pain, shortness of breath, or peripheral edema. No lightheadedness, headaches, syncope, orthopnea, or PND.  (+)   Past Medical History:   Diagnosis Date   HLD (hyperlipidemia)    Osteoporosis    Rheumatoid arthritis (HCC)     No past surgical history on file.  Current Medications: No outpatient medications have been marked as taking for the 10/20/22 encounter (Appointment) with Parke Poisson, MD.     Allergies:   Patient has no known allergies.   Social History   Tobacco Use   Smoking status: Never   Smokeless tobacco: Never     Family History: The patient's family history includes Stroke in his brother and father.  ROS:   Please see the history of present illness.     All other systems reviewed and are negative.  EKGs/Labs/Other Studies Reviewed:    The following studies were reviewed today:  CT Cardiac Scoring 01/21/21 1. Coronary calcium score is 3630 and this is at percentile 97 for patients of the same age, gender and ethnicity. 2. Scattered tiny pulmonary nodules, some which are calcified. The calcified nodules are compatible with calcified granulomas. The other small nodules are indeterminate. No follow-up needed if patient is low-risk (and has no known or suspected primary neoplasm). Non-contrast chest CT can be considered in 12 months if patient is high-risk. This recommendation follows the consensus statement: Guidelines for Management of Incidental Pulmonary Nodules Detected on CT Images: From the Fleischner Society 2017; Radiology 2017; 284:228-243.  Echo 02/22/21 1. Left ventricular ejection fraction, by estimation, is 60 to 65%. The  left ventricle has normal function.  The left ventricle has no regional wall motion abnormalities. Left ventricular diastolic parameters are consistent with Grade I diastolic dysfunction (impaired relaxation). The average left ventricular global longitudinal strain is -25.4 %. The global longitudinal strain is normal.   2. Right ventricular systolic function is normal. The right ventricular size is normal. There is normal pulmonary artery systolic pressure.    3. The mitral valve is normal in structure. Mild mitral valve regurgitation. No evidence of mitral stenosis.   4. The aortic valve is tricuspid. There is mild calcification of the  aortic valve. There is mild thickening of the aortic valve. Aortic valve  regurgitation is mild. No aortic stenosis is present. Aortic regurgitation  PHT measures 603 msec.   5. The inferior vena cava is normal in size with greater than 50%  respiratory variability, suggesting right atrial pressure of 3 mmHg.   Chest CT 12/29/2021: IMPRESSION: 1. Stable 3 mm noncalcified anterior left lower lobe lung nodule. No follow-up needed if patient is low-risk.This recommendation follows the consensus statement: Guidelines for Management of Incidental Pulmonary Nodules Detected on CT Images: From the Fleischner Society 2017; Radiology 2017; 284:228-243. 2. Marked severity coronary artery calcification.  EKG:  EKG is personally reviewed. 07/20/2022: Sinus rhythm. Rate 68 bpm. 11/17/2021: NSR 04/06/21: NSR, HR 74  Imaging studies that I have independently reviewed today: n/a  Recent Labs: No results found for requested labs within last 365 days.  Recent Lipid Panel    Component Value Date/Time   CHOL 124 11/15/2021 1104   TRIG 57 11/15/2021 1104   HDL 49 11/15/2021 1104   CHOLHDL 2.5 11/15/2021 1104   LDLCALC 62 11/15/2021 1104    Physical Exam:    VS:  There were no vitals taken for this visit.    Wt Readings from Last 5 Encounters:  07/20/22 186 lb 9.6 oz (84.6 kg)  11/17/21 188 lb 9.6 oz (85.5 kg)  05/17/21 195 lb 9.6 oz (88.7 kg)  04/06/21 196 lb 6.4 oz (89.1 kg)    Constitutional: No acute distress Eyes: sclera non-icteric, normal conjunctiva and lids ENMT: normal dentition, moist mucous membranes Cardiovascular:  regular rhythm, normal rate, no murmur. S1 and S2 normal. No jugular venous distention.  Respiratory: clear to auscultation bilaterally GI : normal bowel sounds, soft and nontender. No  distention.   MSK: extremities warm, well perfused. No edema.  NEURO: grossly nonfocal exam, moves all extremities. PSYCH: alert and oriented x 3, normal mood and affect.   ASSESSMENT:    No diagnosis found.  PLAN:    Hyperlipidemia, unspecified hyperlipidemia type - Elevated coronary artery calcium score - crestor 40 mg daily, tolerating well. Lipids optimized 11/15/21. -continue ASA 81 mg daily, shared decision making.  - offered stress testing/CCTA for severely elevated CAC score. He is asymptomatic and exercising well. We agreed ok to defer at this time.  Mild mitral valve regurgitation Mild aortic valve insufficiency - review at follow up, could repeat echo prior to next visit. No significant change on exam.  Elevated BP - two serial elevated BP readings over our visits. Agreed to start amlodipine 5 mg daily. Will review BP log at close follow up.   *** Plan: - Follow up in *** -    Total time of encounter: *** minutes total time of encounter, including *** minutes spent in face-to-face patient care on the date of this encounter. This time includes coordination of care and counseling regarding above mentioned problem list. Remainder of non-face-to-face time involved reviewing chart documents/testing  relevant to the patient encounter and documentation in the medical record. I have independently reviewed documentation from referring provider.   Weston Brass, MD, [facility]  [facility]   Shared Decision Making/Informed Consent:       Medication Adjustments/Labs and Tests Ordered: Current medicines are reviewed at length with the patient today.  Concerns regarding medicines are outlined above.   No orders of the defined types were placed in this encounter.  No orders of the defined types were placed in this encounter.  There are no Patient Instructions on file for this visit.   I,Rachel Rivera,acting as a scribe for Parke Poisson, MD.,have documented  all relevant documentation on the behalf of Parke Poisson, MD,as directed by  Parke Poisson, MD while in the presence of Parke Poisson, MD.  ***

## 2022-10-20 ENCOUNTER — Encounter: Payer: Self-pay | Admitting: Internal Medicine

## 2022-10-20 ENCOUNTER — Ambulatory Visit: Payer: Medicare Other | Attending: Internal Medicine | Admitting: Internal Medicine

## 2022-10-20 VITALS — BP 146/80 | HR 85 | Ht 75.0 in | Wt 186.8 lb

## 2022-10-20 DIAGNOSIS — E785 Hyperlipidemia, unspecified: Secondary | ICD-10-CM | POA: Diagnosis not present

## 2022-10-20 DIAGNOSIS — R931 Abnormal findings on diagnostic imaging of heart and coronary circulation: Secondary | ICD-10-CM

## 2022-10-20 DIAGNOSIS — M069 Rheumatoid arthritis, unspecified: Secondary | ICD-10-CM | POA: Diagnosis not present

## 2022-10-20 DIAGNOSIS — I340 Nonrheumatic mitral (valve) insufficiency: Secondary | ICD-10-CM

## 2022-10-20 DIAGNOSIS — I351 Nonrheumatic aortic (valve) insufficiency: Secondary | ICD-10-CM

## 2022-10-20 DIAGNOSIS — I10 Essential (primary) hypertension: Secondary | ICD-10-CM | POA: Diagnosis not present

## 2022-10-20 NOTE — Patient Instructions (Signed)
Medication Instructions:  No Changes In Medications at this time.  *If you need a refill on your cardiac medications before your next appointment, please call your pharmacy*  Follow-Up: At [facility], you and your health needs are our priority.  As part of our continuing mission to provide you with exceptional heart care, we have created designated Provider Care Teams.  These Care Teams include your primary Cardiologist (physician) and Advanced Practice Providers (APPs -  Physician Assistants and Nurse Practitioners) who all work together to provide you with the care you need, when you need it.  Your next appointment:   6 month(s)  Provider:   Gayatri A Acharya, MD    

## 2022-11-09 IMAGING — CT CT CARDIAC CORONARY ARTERY CALCIUM SCORE
3 series · 13 of 20 positions shown, 15 images · non-contrast
Comparison: None.

CLINICAL DATA: 69-year-old white male with hypercholesterolemia.

EXAM:
CT CARDIAC CORONARY ARTERY CALCIUM SCORE
TECHNIQUE: Non-contrast imaging through the heart was performed using
prospective ECG gating. Image post processing was performed on an
independent workstation, allowing for quantitative analysis of the
heart and coronary arteries. Note that this exam targets the heart
and the chest was not imaged in its entirety.

[Series 2: calcium scoring 2.00 qr36 bestdiast 71% hrt calciu · axial · 0.37mm/px · z∈[+1638,+1700]mm · 3 of 79 slices shown]
[im 16/79  vessel]
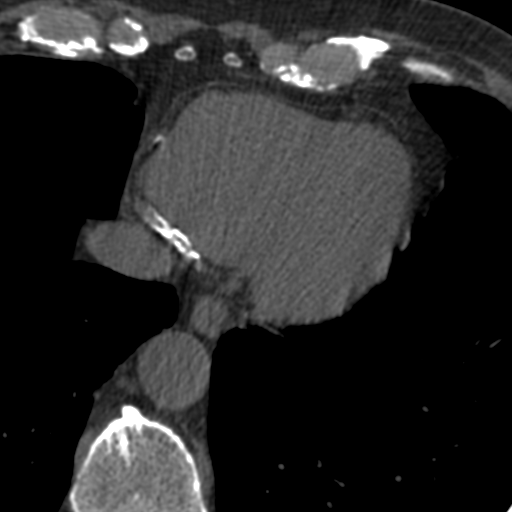
[im 32/79  vessel]
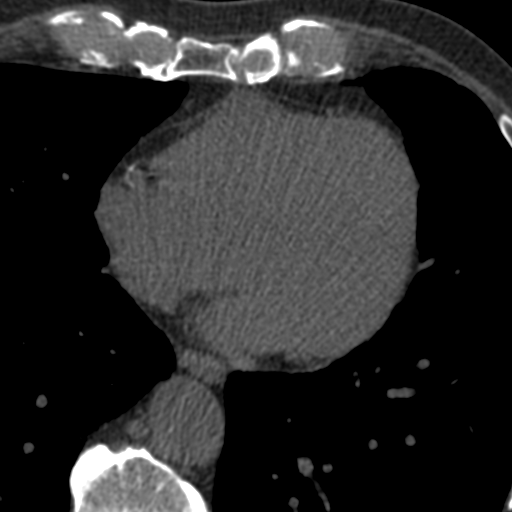
[im 47/79  vessel]
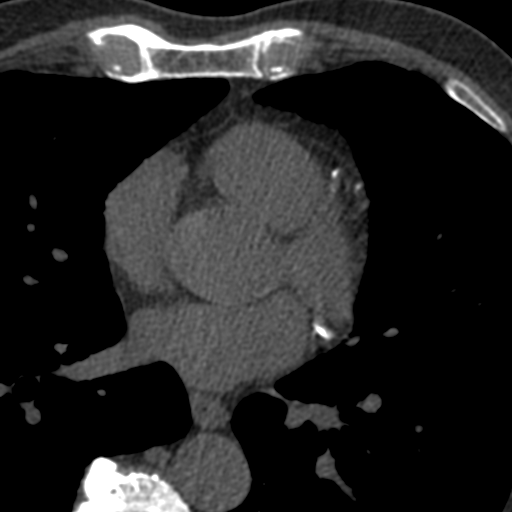

[Series 3: calcium scoring 2.00 br40 bestdiast 71% axial · axial · 0.52mm/px · z∈[+1634,+1738]mm · 5 of 78 slices shown, 7 images]
[im 13/78  vessel]
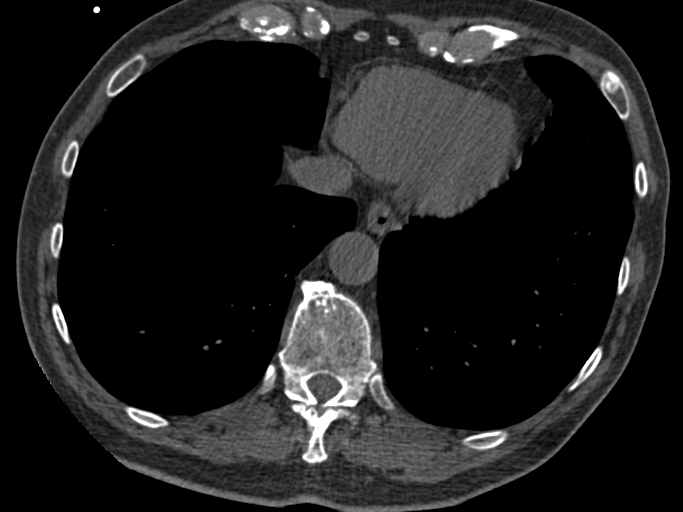
[im 13/78  lung]
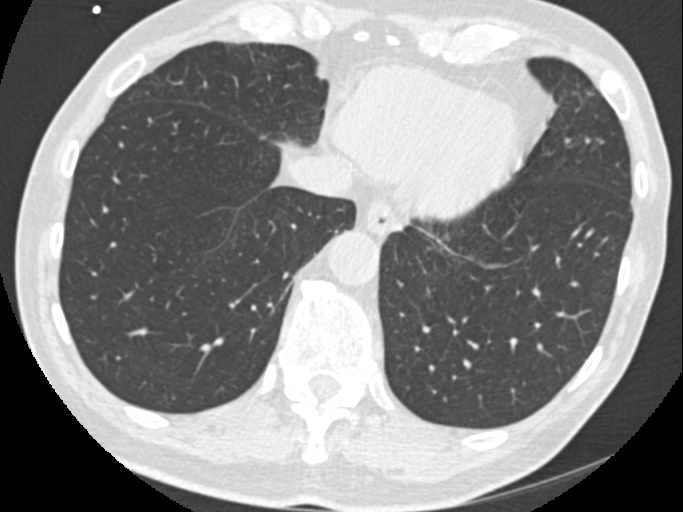
[im 26/78  vessel]
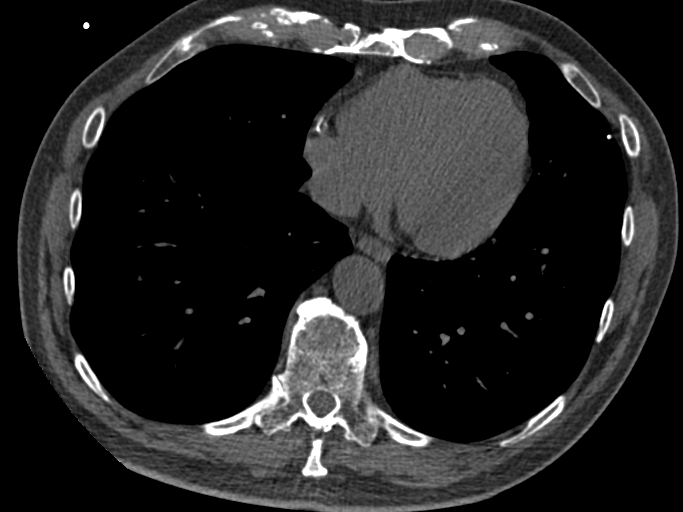
[im 39/78  vessel]
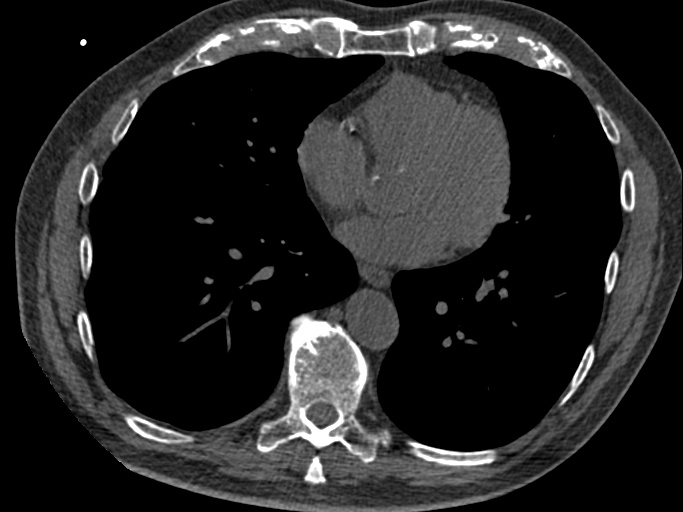
[im 52/78  vessel]
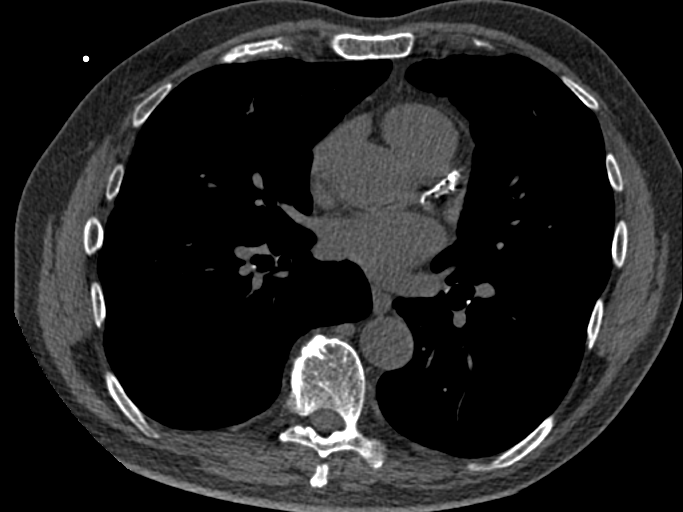
[im 65/78  vessel]
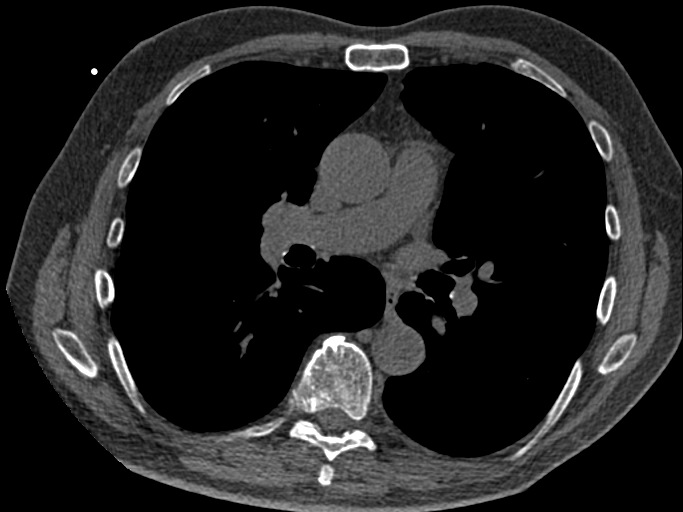
[im 65/78  lung]
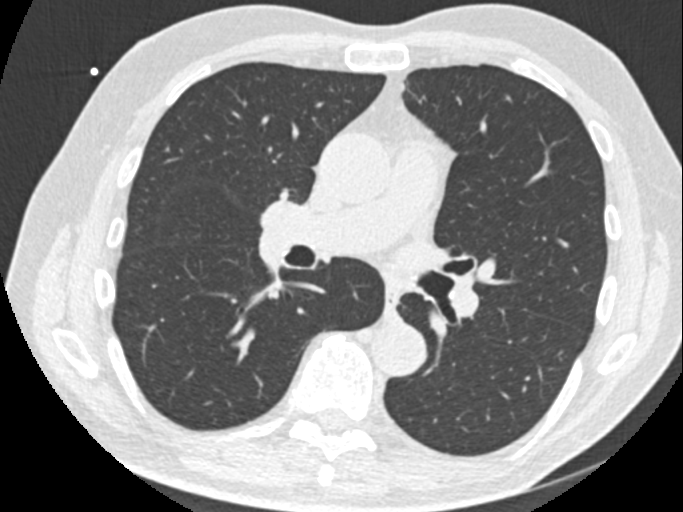

[Series 9: calcium scoring 2.00 br60 bestdiast 71% lungs · axial · 0.52mm/px · z∈[+1634,+1738]mm · 5 of 79 slices shown]
[im 14/79  vessel]
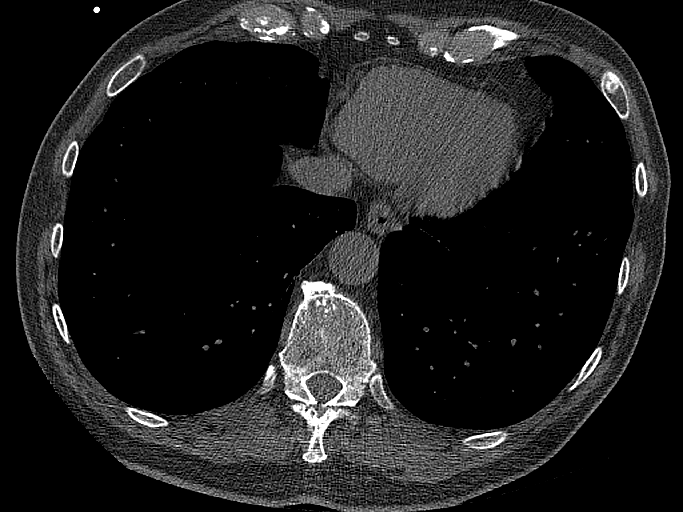
[im 27/79  vessel]
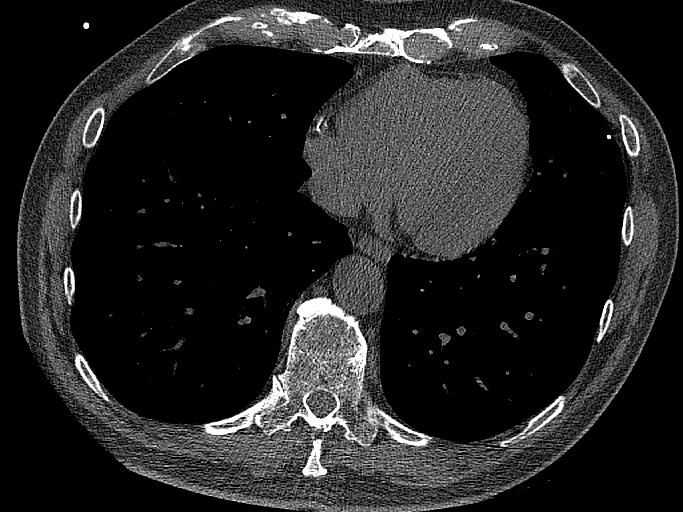
[im 40/79  vessel]
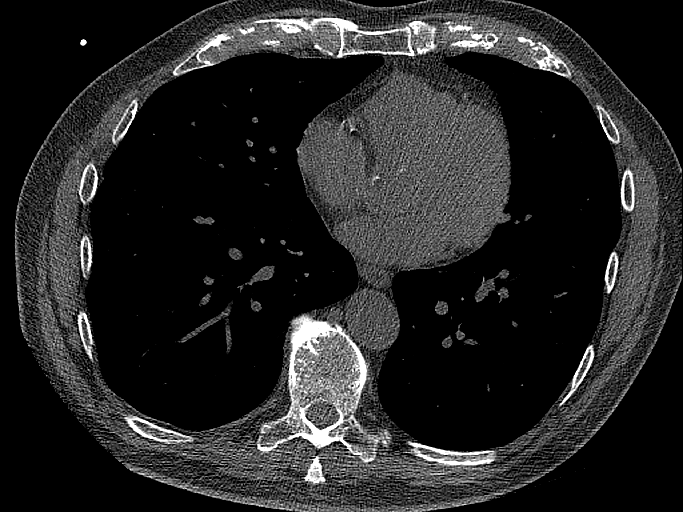
[im 53/79  vessel]
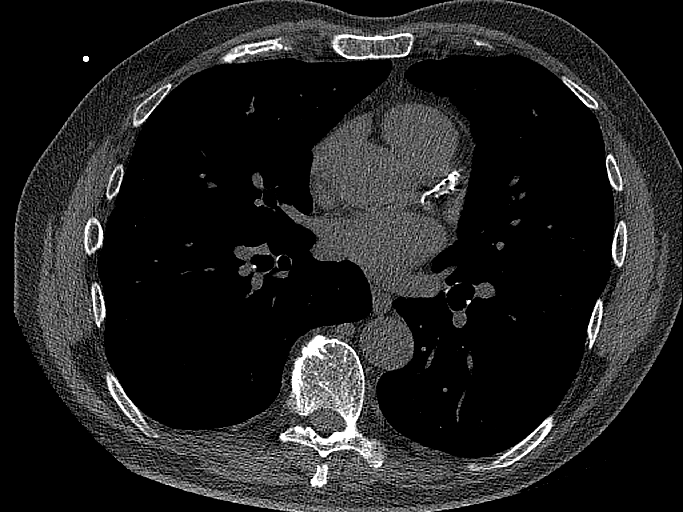
[im 66/79  vessel]
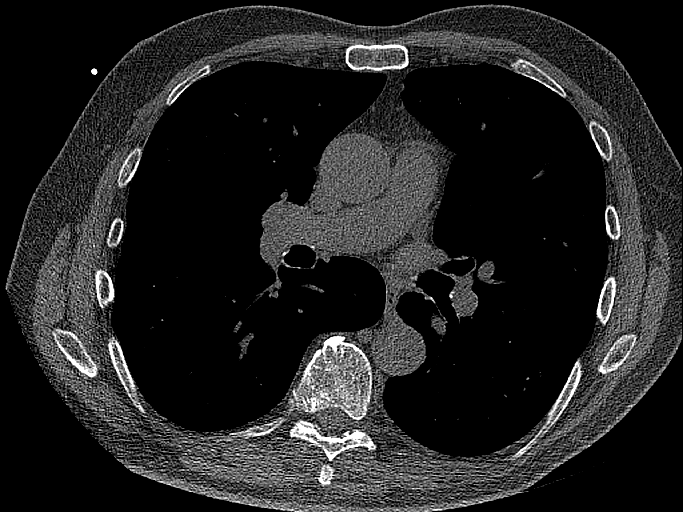

[13 of 20 positions shown; findings below may reference images not displayed]

FINDINGS: CORONARY CALCIUM SCORES:

Left Main: 0

LAD: 5550

LCx: 337

RCA: 9838

Total Agatston Score: 2625

[HOSPITAL] percentile: 97

AORTA MEASUREMENTS:

Ascending Aorta: 35 mm

Descending Aorta: 27 mm

OTHER FINDINGS:

Visualized mediastinal structures are normal. Images of upper
abdomen are unremarkable. No large pleural effusions. Calcified
nodule along the right major fissure on sequence 9 image 33 measures
3 mm. 2 mm nodule along the left major fissure on sequence 9 image
30. Additional small nodular densities, some of which are calcified.
Bridging osteophytes in thoracic spine. No acute bone abnormality.
IMPRESSION: 1. Coronary calcium score is 2625 and this is at percentile 97 for
patients of the same age, gender and ethnicity.
2. Scattered tiny pulmonary nodules, some which are calcified. The
calcified nodules are compatible with calcified granulomas. The
other small nodules are indeterminate. No follow-up needed if
patient is low-risk (and has no known or suspected primary
neoplasm). Non-contrast chest CT can be considered in 12 months if
patient is high-risk. This recommendation follows the consensus
statement: Guidelines for Management of Incidental Pulmonary Nodules
Detected on CT Images: From the [HOSPITAL] 8570; Radiology

## 2022-12-15 DIAGNOSIS — E7800 Pure hypercholesterolemia, unspecified: Secondary | ICD-10-CM | POA: Diagnosis not present

## 2022-12-15 DIAGNOSIS — Z125 Encounter for screening for malignant neoplasm of prostate: Secondary | ICD-10-CM | POA: Diagnosis not present

## 2022-12-15 DIAGNOSIS — M810 Age-related osteoporosis without current pathological fracture: Secondary | ICD-10-CM | POA: Diagnosis not present

## 2022-12-16 ENCOUNTER — Encounter: Payer: Self-pay | Admitting: Internal Medicine

## 2022-12-16 LAB — LAB REPORT - SCANNED: EGFR: 79

## 2022-12-20 DIAGNOSIS — R918 Other nonspecific abnormal finding of lung field: Secondary | ICD-10-CM | POA: Diagnosis not present

## 2022-12-20 DIAGNOSIS — Z0000 Encounter for general adult medical examination without abnormal findings: Secondary | ICD-10-CM | POA: Diagnosis not present

## 2022-12-20 DIAGNOSIS — Z8546 Personal history of malignant neoplasm of prostate: Secondary | ICD-10-CM | POA: Diagnosis not present

## 2022-12-20 DIAGNOSIS — I2510 Atherosclerotic heart disease of native coronary artery without angina pectoris: Secondary | ICD-10-CM | POA: Diagnosis not present

## 2022-12-20 DIAGNOSIS — J309 Allergic rhinitis, unspecified: Secondary | ICD-10-CM | POA: Diagnosis not present

## 2022-12-20 DIAGNOSIS — M05751 Rheumatoid arthritis with rheumatoid factor of right hip without organ or systems involvement: Secondary | ICD-10-CM | POA: Diagnosis not present

## 2022-12-20 DIAGNOSIS — Z79899 Other long term (current) drug therapy: Secondary | ICD-10-CM | POA: Diagnosis not present

## 2022-12-20 DIAGNOSIS — M810 Age-related osteoporosis without current pathological fracture: Secondary | ICD-10-CM | POA: Diagnosis not present

## 2022-12-20 DIAGNOSIS — I10 Essential (primary) hypertension: Secondary | ICD-10-CM | POA: Diagnosis not present

## 2022-12-20 DIAGNOSIS — I2584 Coronary atherosclerosis due to calcified coronary lesion: Secondary | ICD-10-CM | POA: Diagnosis not present

## 2023-01-02 DIAGNOSIS — Z79899 Other long term (current) drug therapy: Secondary | ICD-10-CM | POA: Diagnosis not present

## 2023-01-02 DIAGNOSIS — Z8546 Personal history of malignant neoplasm of prostate: Secondary | ICD-10-CM | POA: Diagnosis not present

## 2023-01-02 DIAGNOSIS — M8580 Other specified disorders of bone density and structure, unspecified site: Secondary | ICD-10-CM | POA: Diagnosis not present

## 2023-01-02 DIAGNOSIS — M25512 Pain in left shoulder: Secondary | ICD-10-CM | POA: Diagnosis not present

## 2023-01-02 DIAGNOSIS — M0589 Other rheumatoid arthritis with rheumatoid factor of multiple sites: Secondary | ICD-10-CM | POA: Diagnosis not present

## 2023-01-02 DIAGNOSIS — M545 Low back pain, unspecified: Secondary | ICD-10-CM | POA: Diagnosis not present

## 2023-01-04 ENCOUNTER — Other Ambulatory Visit: Payer: Self-pay | Admitting: Internal Medicine

## 2023-01-04 DIAGNOSIS — R918 Other nonspecific abnormal finding of lung field: Secondary | ICD-10-CM

## 2023-01-04 DIAGNOSIS — C44319 Basal cell carcinoma of skin of other parts of face: Secondary | ICD-10-CM | POA: Diagnosis not present

## 2023-01-04 DIAGNOSIS — C44519 Basal cell carcinoma of skin of other part of trunk: Secondary | ICD-10-CM | POA: Diagnosis not present

## 2023-01-27 ENCOUNTER — Ambulatory Visit
Admission: RE | Admit: 2023-01-27 | Discharge: 2023-01-27 | Disposition: A | Discharge status: Home / Self Care | Payer: Medicare Other | Source: Ambulatory Visit | Attending: Internal Medicine | Admitting: Internal Medicine

## 2023-01-27 DIAGNOSIS — R918 Other nonspecific abnormal finding of lung field: Secondary | ICD-10-CM

## 2023-01-27 DIAGNOSIS — I700 Atherosclerosis of aorta: Secondary | ICD-10-CM | POA: Diagnosis not present

## 2023-02-14 DIAGNOSIS — Z85828 Personal history of other malignant neoplasm of skin: Secondary | ICD-10-CM | POA: Diagnosis not present

## 2023-02-14 DIAGNOSIS — Z08 Encounter for follow-up examination after completed treatment for malignant neoplasm: Secondary | ICD-10-CM | POA: Diagnosis not present

## 2023-03-21 DIAGNOSIS — M810 Age-related osteoporosis without current pathological fracture: Secondary | ICD-10-CM | POA: Diagnosis not present

## 2023-04-06 DIAGNOSIS — Z23 Encounter for immunization: Secondary | ICD-10-CM | POA: Diagnosis not present

## 2023-05-01 DIAGNOSIS — M25512 Pain in left shoulder: Secondary | ICD-10-CM | POA: Diagnosis not present

## 2023-05-01 DIAGNOSIS — M8580 Other specified disorders of bone density and structure, unspecified site: Secondary | ICD-10-CM | POA: Diagnosis not present

## 2023-05-01 DIAGNOSIS — Z79899 Other long term (current) drug therapy: Secondary | ICD-10-CM | POA: Diagnosis not present

## 2023-05-01 DIAGNOSIS — M0589 Other rheumatoid arthritis with rheumatoid factor of multiple sites: Secondary | ICD-10-CM | POA: Diagnosis not present

## 2023-05-01 DIAGNOSIS — M545 Low back pain, unspecified: Secondary | ICD-10-CM | POA: Diagnosis not present

## 2023-05-24 ENCOUNTER — Encounter: Payer: Self-pay | Admitting: Internal Medicine

## 2023-05-24 ENCOUNTER — Ambulatory Visit: Payer: Medicare Other | Attending: Internal Medicine | Admitting: Internal Medicine

## 2023-05-24 VITALS — BP 130/60 | HR 73 | Ht 75.0 in | Wt 200.0 lb

## 2023-05-24 DIAGNOSIS — E785 Hyperlipidemia, unspecified: Secondary | ICD-10-CM

## 2023-05-24 DIAGNOSIS — I10 Essential (primary) hypertension: Secondary | ICD-10-CM | POA: Diagnosis not present

## 2023-05-24 DIAGNOSIS — I340 Nonrheumatic mitral (valve) insufficiency: Secondary | ICD-10-CM

## 2023-05-24 DIAGNOSIS — R931 Abnormal findings on diagnostic imaging of heart and coronary circulation: Secondary | ICD-10-CM

## 2023-05-24 DIAGNOSIS — M069 Rheumatoid arthritis, unspecified: Secondary | ICD-10-CM | POA: Diagnosis not present

## 2023-05-24 DIAGNOSIS — I351 Nonrheumatic aortic (valve) insufficiency: Secondary | ICD-10-CM | POA: Diagnosis not present

## 2023-05-24 NOTE — Progress Notes (Signed)
Cardiology Office Note:    Date:  05/24/2023  ID:  Eric Solomon, DOB 06/11/1951, MRN [id number]  PCP:  Merri Brunette, MD  Cardiologist:  Parke Poisson, MD  Electrophysiologist:  None   Referring MD: Merri Brunette, MD   Chief Complaint/Reason for Referral: CAC  History of Present Illness:    Eric Solomon is a 72 y.o. male with a history of hyperlipidemia, rheumatoid arthritis, osteoporosis, and coronary artery disease here today for follow-up. His calcium score from 01/21/21 was 3630 which is at percentile 37 for age and sex.   05/24/23: Patient doing well overall with no chest pain with exercise.  Continues to care for his wife who is on home hospice for dementia.  Laboratory studies remain stable and no concerning findings with serial blood pressure readings.  EKG is normal today.  At a prior visit on 11/17/2021, he was doing well and tolerating statin therapy. We discussed intensifying therapy to max tolerated dose. He continued to exercise on bike, no symptoms.   At his last visit, he reported life stressors related to his wife having worsening dementia. He otherwise was feeling well overall. He was trying to manage his blood pressure. About 2 weeks prior, he had an appointment with rheumatology where his blood pressure was 135/80. In clinic during this visit it was 136/80. We discussed medications that may help him manage his hypertension.   His rheumatologist monitors his creatinine and BUN levels. He reported that his BUN was outside the higher range and he was encouraged to drink more water. He took Methotrexate 2.5 mg/ml.   He had been monitoring and managing his cholesterol levels with Crestor 40 mg. His cholesterol levels from labs at the time were within normal limits.    He had not been able to go to the [facility] as frequently as he would like due to life stressors.  He reports his father passed at 28 y/o from a stroke and his brother at age 16 y/o also had a stroke.    Today, he is overall doing well. He reports his wife has been receiving at home hospice care. She has been using a wrist blood pressure cuff, which he has also been using to monitor his blood pressure at home. He is taking his blood pressure around 4 pm at a calm time of the day for him. He reports readings of 117/65, 120/76, and 124/68 and notes it overall averages in the 120/65-70s.   He states he has not had blood work to monitor his cholesterol since 10/2021. He is scheduled for a physical with his PCP in June. He is compliant with 40 mg Rosuvastatin daily.  LDL on last check was 62, triglycerides 51.  Cardiovascular status well-controlled with current dose of Crestor.  He recently has been feeling fatigued throughout the day. Typically he would sleep from 11 pm to 6-6:30 am. He has lately been trying to go to sleep around 10 pm. He notes he is the main caregiver of his wife with the help of a CNA and hospice.   He denies any palpitations, chest pain, shortness of breath, or peripheral edema. No lightheadedness, headaches, syncope, orthopnea, or PND.  Past Medical History:  Diagnosis Date   HLD (hyperlipidemia)    Osteoporosis    Rheumatoid arthritis (HCC)     No past surgical history on file.  Current Medications: Current Meds  Medication Sig   amLODipine (NORVASC) 5 MG tablet Take 1 tablet (5 mg total) by mouth  daily.   aspirin 81 MG chewable tablet 1 tablet   Calcium Carbonate-Vitamin D3 600-400 MG-UNIT TABS 1 tablet with food   denosumab (PROLIA) 60 MG/ML SOSY injection 60 mg every 6 (six) months.   folic acid (FOLVITE) 400 MCG tablet Take 400 mcg by mouth in the morning and at bedtime.   methotrexate (RHEUMATREX) 2.5 MG tablet Take 2.5 mg by mouth once a week. Patient takes 3 tablets Tuesday morning   Multiple Vitamin (ONE DAILY MULTIVITAMIN ADULT) TABS    Omega-3 Fatty Acids (FISH OIL) 1000 MG CAPS daily in the afternoon.   rosuvastatin (CRESTOR) 40 MG tablet Take 40 mg by  mouth daily.     Allergies:   Patient has no known allergies.   Social History   Tobacco Use   Smoking status: Never   Smokeless tobacco: Never     Family History: The patient's family history includes Stroke in his brother and father.  ROS:   Please see the history of present illness.     All other systems reviewed and are negative.  EKGs/Labs/Other Studies Reviewed:    The following studies were reviewed today:  CT Cardiac Scoring 01/21/21 1. Coronary calcium score is 3630 and this is at percentile 97 for patients of the same age, gender and ethnicity. 2. Scattered tiny pulmonary nodules, some which are calcified. The calcified nodules are compatible with calcified granulomas. The other small nodules are indeterminate. No follow-up needed if patient is low-risk (and has no known or suspected primary neoplasm). Non-contrast chest CT can be considered in 12 months if patient is high-risk. This recommendation follows the consensus statement: Guidelines for Management of Incidental Pulmonary Nodules Detected on CT Images: From the Fleischner Society 2017; Radiology 2017; 284:228-243.  Echo 02/22/21 1. Left ventricular ejection fraction, by estimation, is 60 to 65%. The  left ventricle has normal function. The left ventricle has no regional wall motion abnormalities. Left ventricular diastolic parameters are consistent with Grade I diastolic dysfunction (impaired relaxation). The average left ventricular global longitudinal strain is -25.4 %. The global longitudinal strain is normal.   2. Right ventricular systolic function is normal. The right ventricular size is normal. There is normal pulmonary artery systolic pressure.   3. The mitral valve is normal in structure. Mild mitral valve regurgitation. No evidence of mitral stenosis.   4. The aortic valve is tricuspid. There is mild calcification of the  aortic valve. There is mild thickening of the aortic valve. Aortic valve   regurgitation is mild. No aortic stenosis is present. Aortic regurgitation  PHT measures 603 msec.   5. The inferior vena cava is normal in size with greater than 50%  respiratory variability, suggesting right atrial pressure of 3 mmHg.   Chest CT 12/29/2021: IMPRESSION: 1. Stable 3 mm noncalcified anterior left lower lobe lung nodule. No follow-up needed if patient is low-risk.This recommendation follows the consensus statement: Guidelines for Management of Incidental Pulmonary Nodules Detected on CT Images: From the Fleischner Society 2017; Radiology 2017; 284:228-243. 2. Marked severity coronary artery calcification.  EKG:  EKG Interpretation Date/Time:  Wednesday May 24 2023 13:31:11 EST Ventricular Rate:  73 PR Interval:  178 QRS Duration:  96 QT Interval:  394 QTC Calculation: 434 R Axis:   -26  Text Interpretation: Normal sinus rhythm Normal ECG Confirmed by Weston Brass ([id number]) on 05/24/2023 2:00:18 PM   Imaging studies that I have independently reviewed today: n/a  Recent Labs: No results found for requested labs within last 365 days.  Recent Lipid Panel    Component Value Date/Time   CHOL 124 11/15/2021 1104   TRIG 57 11/15/2021 1104   HDL 49 11/15/2021 1104   CHOLHDL 2.5 11/15/2021 1104   LDLCALC 62 11/15/2021 1104    Physical Exam:    VS:  BP 130/60   Pulse 73   Ht 6\' 3"  (1.905 m)   Wt 200 lb (90.7 kg)   SpO2 96%   BMI 25.00 kg/m     Wt Readings from Last 5 Encounters:  05/24/23 200 lb (90.7 kg)  10/20/22 186 lb 12.8 oz (84.7 kg)  07/20/22 186 lb 9.6 oz (84.6 kg)  11/17/21 188 lb 9.6 oz (85.5 kg)  05/17/21 195 lb 9.6 oz (88.7 kg)    Constitutional: No acute distress Eyes: sclera non-icteric, normal conjunctiva and lids ENMT: normal dentition, moist mucous membranes Cardiovascular:  regular rhythm, normal rate, no murmur. S1 and S2 normal. No jugular venous distention.  Respiratory: clear to auscultation bilaterally GI : normal bowel  sounds, soft and nontender. No distention.   MSK: extremities warm, well perfused. No edema.  NEURO: grossly nonfocal exam, moves all extremities. PSYCH: alert and oriented x 3, normal mood and affect.   ASSESSMENT:    1. Primary hypertension   2. Nonrheumatic mitral valve regurgitation   3. Hyperlipidemia, unspecified hyperlipidemia type   4. Nonrheumatic aortic valve insufficiency   5. Elevated coronary artery calcium score   6. Rheumatoid arthritis involving multiple sites, unspecified whether rheumatoid factor present (HCC)     PLAN:    Hyperlipidemia, unspecified hyperlipidemia type - Elevated coronary artery calcium score - crestor 40 mg daily, tolerating well. Lipids optimized, LDL most recently 58.  -continue ASA 81 mg daily, shared decision making.  -Discussed stress testing/CCTA for severely elevated CAC score. He is asymptomatic and exercising well. We agreed ok to defer.  Mild mitral valve regurgitation Mild aortic valve insufficiency - could repeat echo prior to next visit. No significant change on exam.  HTN -Tolerating amlodipine 5 mg daily well with well-controlled blood pressures at home.  Continue current therapy.    Total time of encounter: 30 minutes total time of encounter, including 22 minutes spent in face-to-face patient care on the date of this encounter. This time includes coordination of care and counseling regarding above mentioned problem list. Remainder of non-face-to-face time involved reviewing chart documents/testing relevant to the patient encounter and documentation in the medical record. I have independently reviewed documentation from referring provider.   Weston Brass, MD, [facility]  [facility]   Shared Decision Making/Informed Consent:       Medication Adjustments/Labs and Tests Ordered: Current medicines are reviewed at length with the patient today.  Concerns regarding medicines are outlined above.   Orders Placed  This Encounter  Procedures   EKG 12-Lead   ECHOCARDIOGRAM COMPLETE   No orders of the defined types were placed in this encounter.  Patient Instructions  Medication Instructions:  Your physician recommends that you continue on your current medications as directed. Please refer to the Current Medication list given to you today.  *If you need a refill on your cardiac medications before your next appointment, please call your pharmacy*   Testing/Procedures: Your physician has requested that you have an echocardiogram. Echocardiography is a painless test that uses sound waves to create images of your heart. It provides your doctor with information about the size and shape of your heart and how well your heart's chambers and valves are working. This  procedure takes approximately one hour. There are no restrictions for this procedure. Please do NOT wear cologne, perfume, aftershave, or lotions (deodorant is allowed). Please arrive 15 minutes prior to your appointment time. **To do in November 2025**   Follow-Up: At [facility], you and your health needs are our priority.  As part of our continuing mission to provide you with exceptional heart care, we have created designated Provider Care Teams.  These Care Teams include your primary Cardiologist (physician) and Advanced Practice Providers (APPs -  Physician Assistants and Nurse Practitioners) who all work together to provide you with the care you need, when you need it.  We recommend signing up for the patient portal called "MyChart".  Sign up information is provided on this After Visit Summary.  MyChart is used to connect with patients for Virtual Visits (Telemedicine).  Patients are able to view lab/test results, encounter notes, upcoming appointments, etc.  Non-urgent messages can be sent to your provider as well.   To learn more about what you can do with MyChart, go to [email or web address].    Your next appointment:   12  month(s)  Provider:   Parke Poisson, MD

## 2023-05-24 NOTE — Patient Instructions (Signed)
Medication Instructions:  Your physician recommends that you continue on your current medications as directed. Please refer to the Current Medication list given to you today.  *If you need a refill on your cardiac medications before your next appointment, please call your pharmacy*   Testing/Procedures: Your physician has requested that you have an echocardiogram. Echocardiography is a painless test that uses sound waves to create images of your heart. It provides your doctor with information about the size and shape of your heart and how well your heart's chambers and valves are working. This procedure takes approximately one hour. There are no restrictions for this procedure. Please do NOT wear cologne, perfume, aftershave, or lotions (deodorant is allowed). Please arrive 15 minutes prior to your appointment time. **To do in November 2025**   Follow-Up: At [facility], you and your health needs are our priority.  As part of our continuing mission to provide you with exceptional heart care, we have created designated Provider Care Teams.  These Care Teams include your primary Cardiologist (physician) and Advanced Practice Providers (APPs -  Physician Assistants and Nurse Practitioners) who all work together to provide you with the care you need, when you need it.  We recommend signing up for the patient portal called "MyChart".  Sign up information is provided on this After Visit Summary.  MyChart is used to connect with patients for Virtual Visits (Telemedicine).  Patients are able to view lab/test results, encounter notes, upcoming appointments, etc.  Non-urgent messages can be sent to your provider as well.   To learn more about what you can do with MyChart, go to [email or web address].    Your next appointment:   12 month(s)  Provider:   Parke Poisson, MD

## 2023-06-06 DIAGNOSIS — Z08 Encounter for follow-up examination after completed treatment for malignant neoplasm: Secondary | ICD-10-CM | POA: Diagnosis not present

## 2023-06-06 DIAGNOSIS — Z85828 Personal history of other malignant neoplasm of skin: Secondary | ICD-10-CM | POA: Diagnosis not present

## 2023-07-03 ENCOUNTER — Other Ambulatory Visit: Payer: Self-pay | Admitting: Internal Medicine

## 2023-07-04 DIAGNOSIS — H53143 Visual discomfort, bilateral: Secondary | ICD-10-CM | POA: Diagnosis not present

## 2023-07-04 DIAGNOSIS — H40013 Open angle with borderline findings, low risk, bilateral: Secondary | ICD-10-CM | POA: Diagnosis not present

## 2023-07-04 DIAGNOSIS — H40059 Ocular hypertension, unspecified eye: Secondary | ICD-10-CM | POA: Diagnosis not present

## 2023-07-04 DIAGNOSIS — H524 Presbyopia: Secondary | ICD-10-CM | POA: Diagnosis not present

## 2023-07-04 DIAGNOSIS — H5203 Hypermetropia, bilateral: Secondary | ICD-10-CM | POA: Diagnosis not present

## 2023-07-04 DIAGNOSIS — H52221 Regular astigmatism, right eye: Secondary | ICD-10-CM | POA: Diagnosis not present

## 2023-07-09 ENCOUNTER — Encounter: Payer: Self-pay | Admitting: Internal Medicine

## 2023-07-10 MED ORDER — AMLODIPINE BESYLATE 5 MG PO TABS: 5.0000 mg | ORAL_TABLET | Freq: Every day | ORAL | 3 refills | Status: DC

## 2023-08-01 DIAGNOSIS — M0589 Other rheumatoid arthritis with rheumatoid factor of multiple sites: Secondary | ICD-10-CM | POA: Diagnosis not present

## 2023-08-14 DIAGNOSIS — R739 Hyperglycemia, unspecified: Secondary | ICD-10-CM | POA: Diagnosis not present

## 2023-09-19 DIAGNOSIS — M810 Age-related osteoporosis without current pathological fracture: Secondary | ICD-10-CM | POA: Diagnosis not present

## 2023-10-30 DIAGNOSIS — M25512 Pain in left shoulder: Secondary | ICD-10-CM | POA: Diagnosis not present

## 2023-10-30 DIAGNOSIS — Z8546 Personal history of malignant neoplasm of prostate: Secondary | ICD-10-CM | POA: Diagnosis not present

## 2023-10-30 DIAGNOSIS — M545 Low back pain, unspecified: Secondary | ICD-10-CM | POA: Diagnosis not present

## 2023-10-30 DIAGNOSIS — M0589 Other rheumatoid arthritis with rheumatoid factor of multiple sites: Secondary | ICD-10-CM | POA: Diagnosis not present

## 2023-10-30 DIAGNOSIS — Z79899 Other long term (current) drug therapy: Secondary | ICD-10-CM | POA: Diagnosis not present

## 2023-10-30 DIAGNOSIS — M8580 Other specified disorders of bone density and structure, unspecified site: Secondary | ICD-10-CM | POA: Diagnosis not present

## 2023-10-31 DIAGNOSIS — M25552 Pain in left hip: Secondary | ICD-10-CM | POA: Diagnosis not present

## 2023-10-31 DIAGNOSIS — Z79899 Other long term (current) drug therapy: Secondary | ICD-10-CM | POA: Diagnosis not present

## 2023-10-31 DIAGNOSIS — M25551 Pain in right hip: Secondary | ICD-10-CM | POA: Diagnosis not present

## 2023-10-31 DIAGNOSIS — M545 Low back pain, unspecified: Secondary | ICD-10-CM | POA: Diagnosis not present

## 2023-12-05 DIAGNOSIS — Z85828 Personal history of other malignant neoplasm of skin: Secondary | ICD-10-CM | POA: Diagnosis not present

## 2023-12-05 DIAGNOSIS — C44319 Basal cell carcinoma of skin of other parts of face: Secondary | ICD-10-CM | POA: Diagnosis not present

## 2023-12-05 DIAGNOSIS — Z08 Encounter for follow-up examination after completed treatment for malignant neoplasm: Secondary | ICD-10-CM | POA: Diagnosis not present

## 2023-12-21 DIAGNOSIS — E7800 Pure hypercholesterolemia, unspecified: Secondary | ICD-10-CM | POA: Diagnosis not present

## 2023-12-21 DIAGNOSIS — Z125 Encounter for screening for malignant neoplasm of prostate: Secondary | ICD-10-CM | POA: Diagnosis not present

## 2023-12-26 DIAGNOSIS — J309 Allergic rhinitis, unspecified: Secondary | ICD-10-CM | POA: Diagnosis not present

## 2023-12-26 DIAGNOSIS — I10 Essential (primary) hypertension: Secondary | ICD-10-CM | POA: Diagnosis not present

## 2023-12-26 DIAGNOSIS — I83893 Varicose veins of bilateral lower extremities with other complications: Secondary | ICD-10-CM | POA: Diagnosis not present

## 2023-12-26 DIAGNOSIS — I2510 Atherosclerotic heart disease of native coronary artery without angina pectoris: Secondary | ICD-10-CM | POA: Diagnosis not present

## 2023-12-26 DIAGNOSIS — Z0000 Encounter for general adult medical examination without abnormal findings: Secondary | ICD-10-CM | POA: Diagnosis not present

## 2023-12-26 DIAGNOSIS — M05751 Rheumatoid arthritis with rheumatoid factor of right hip without organ or systems involvement: Secondary | ICD-10-CM | POA: Diagnosis not present

## 2023-12-26 DIAGNOSIS — R0989 Other specified symptoms and signs involving the circulatory and respiratory systems: Secondary | ICD-10-CM | POA: Diagnosis not present

## 2023-12-26 DIAGNOSIS — M810 Age-related osteoporosis without current pathological fracture: Secondary | ICD-10-CM | POA: Diagnosis not present

## 2023-12-26 DIAGNOSIS — I83009 Varicose veins of unspecified lower extremity with ulcer of unspecified site: Secondary | ICD-10-CM | POA: Diagnosis not present

## 2023-12-26 DIAGNOSIS — L97909 Non-pressure chronic ulcer of unspecified part of unspecified lower leg with unspecified severity: Secondary | ICD-10-CM | POA: Diagnosis not present

## 2023-12-26 DIAGNOSIS — I872 Venous insufficiency (chronic) (peripheral): Secondary | ICD-10-CM | POA: Diagnosis not present

## 2023-12-27 ENCOUNTER — Other Ambulatory Visit: Payer: Self-pay | Admitting: Internal Medicine

## 2023-12-27 DIAGNOSIS — R0989 Other specified symptoms and signs involving the circulatory and respiratory systems: Secondary | ICD-10-CM

## 2024-01-01 ENCOUNTER — Ambulatory Visit
Admission: RE | Admit: 2024-01-01 | Discharge: 2024-01-01 | Disposition: A | Discharge status: Home / Self Care | Source: Ambulatory Visit | Attending: Internal Medicine | Admitting: Internal Medicine

## 2024-01-01 DIAGNOSIS — R0989 Other specified symptoms and signs involving the circulatory and respiratory systems: Secondary | ICD-10-CM

## 2024-01-16 DIAGNOSIS — Z85828 Personal history of other malignant neoplasm of skin: Secondary | ICD-10-CM | POA: Diagnosis not present

## 2024-01-16 DIAGNOSIS — Z08 Encounter for follow-up examination after completed treatment for malignant neoplasm: Secondary | ICD-10-CM | POA: Diagnosis not present

## 2024-01-18 DIAGNOSIS — I8311 Varicose veins of right lower extremity with inflammation: Secondary | ICD-10-CM | POA: Diagnosis not present

## 2024-01-18 DIAGNOSIS — I83813 Varicose veins of bilateral lower extremities with pain: Secondary | ICD-10-CM | POA: Diagnosis not present

## 2024-01-18 DIAGNOSIS — M7989 Other specified soft tissue disorders: Secondary | ICD-10-CM | POA: Diagnosis not present

## 2024-01-18 DIAGNOSIS — L97319 Non-pressure chronic ulcer of right ankle with unspecified severity: Secondary | ICD-10-CM | POA: Diagnosis not present

## 2024-01-18 DIAGNOSIS — I83891 Varicose veins of right lower extremities with other complications: Secondary | ICD-10-CM | POA: Diagnosis not present

## 2024-01-18 DIAGNOSIS — I83213 Varicose veins of right lower extremity with both ulcer of ankle and inflammation: Secondary | ICD-10-CM | POA: Diagnosis not present

## 2024-01-22 DIAGNOSIS — I2510 Atherosclerotic heart disease of native coronary artery without angina pectoris: Secondary | ICD-10-CM | POA: Diagnosis not present

## 2024-01-23 DIAGNOSIS — I10 Essential (primary) hypertension: Secondary | ICD-10-CM | POA: Diagnosis not present

## 2024-01-23 DIAGNOSIS — I2510 Atherosclerotic heart disease of native coronary artery without angina pectoris: Secondary | ICD-10-CM | POA: Diagnosis not present

## 2024-01-25 DIAGNOSIS — I83891 Varicose veins of right lower extremities with other complications: Secondary | ICD-10-CM | POA: Diagnosis not present

## 2024-01-31 DIAGNOSIS — I872 Venous insufficiency (chronic) (peripheral): Secondary | ICD-10-CM | POA: Diagnosis not present

## 2024-02-06 ENCOUNTER — Telehealth: Payer: Self-pay | Admitting: *Deleted

## 2024-02-06 DIAGNOSIS — I83891 Varicose veins of right lower extremities with other complications: Secondary | ICD-10-CM | POA: Diagnosis not present

## 2024-02-06 NOTE — Telephone Encounter (Signed)
 Called and spoke to pt. Results given for recent labs, per Dr. Acharya. Scheduled him for OV with Aline Door, PA in NOV 2025 after Echo  this is a one year f/u and pt needs to be seen at 1:00 pm or 2:00 pm as he is the caregiver for his wife who has dementia. No openings on MD's schedule. No other concerns.

## 2024-02-06 NOTE — Telephone Encounter (Signed)
-----   Message from Soyla DELENA Merck sent at 02/06/2024  5:50 PM EDT ----- Lipids and LFTs look good, no changes recommended. ----- Message ----- From: Smitty Jonel CROME Sent: 12/27/2023   9:36 AM EDT To: Kryestyn L Grant, RN; Gayatri A Acharya, MD

## 2024-02-12 DIAGNOSIS — M0589 Other rheumatoid arthritis with rheumatoid factor of multiple sites: Secondary | ICD-10-CM | POA: Diagnosis not present

## 2024-02-12 DIAGNOSIS — M8580 Other specified disorders of bone density and structure, unspecified site: Secondary | ICD-10-CM | POA: Diagnosis not present

## 2024-02-12 DIAGNOSIS — Z79899 Other long term (current) drug therapy: Secondary | ICD-10-CM | POA: Diagnosis not present

## 2024-02-14 DIAGNOSIS — I83212 Varicose veins of right lower extremity with both ulcer of calf and inflammation: Secondary | ICD-10-CM | POA: Diagnosis not present

## 2024-02-19 DIAGNOSIS — I83891 Varicose veins of right lower extremities with other complications: Secondary | ICD-10-CM | POA: Diagnosis not present

## 2024-02-27 DIAGNOSIS — I83213 Varicose veins of right lower extremity with both ulcer of ankle and inflammation: Secondary | ICD-10-CM | POA: Diagnosis not present

## 2024-03-06 DIAGNOSIS — I83811 Varicose veins of right lower extremities with pain: Secondary | ICD-10-CM | POA: Diagnosis not present

## 2024-03-06 DIAGNOSIS — I83891 Varicose veins of right lower extremities with other complications: Secondary | ICD-10-CM | POA: Diagnosis not present

## 2024-03-22 DIAGNOSIS — Z23 Encounter for immunization: Secondary | ICD-10-CM | POA: Diagnosis not present

## 2024-03-25 DIAGNOSIS — M810 Age-related osteoporosis without current pathological fracture: Secondary | ICD-10-CM | POA: Diagnosis not present

## 2024-04-01 DIAGNOSIS — Z23 Encounter for immunization: Secondary | ICD-10-CM | POA: Diagnosis not present

## 2024-04-10 DIAGNOSIS — L819 Disorder of pigmentation, unspecified: Secondary | ICD-10-CM | POA: Diagnosis not present

## 2024-04-10 DIAGNOSIS — M79604 Pain in right leg: Secondary | ICD-10-CM | POA: Diagnosis not present

## 2024-04-10 DIAGNOSIS — R600 Localized edema: Secondary | ICD-10-CM | POA: Diagnosis not present

## 2024-04-10 DIAGNOSIS — I8311 Varicose veins of right lower extremity with inflammation: Secondary | ICD-10-CM | POA: Diagnosis not present

## 2024-05-08 ENCOUNTER — Ambulatory Visit: Payer: Self-pay | Admitting: Internal Medicine

## 2024-05-08 ENCOUNTER — Ambulatory Visit (HOSPITAL_COMMUNITY)
Admission: RE | Admit: 2024-05-08 | Discharge: 2024-05-08 | Disposition: A | Discharge status: Home / Self Care | Payer: Medicare Other | Source: Ambulatory Visit | Attending: Internal Medicine | Admitting: Internal Medicine

## 2024-05-08 DIAGNOSIS — I340 Nonrheumatic mitral (valve) insufficiency: Secondary | ICD-10-CM | POA: Diagnosis not present

## 2024-05-08 DIAGNOSIS — I10 Essential (primary) hypertension: Secondary | ICD-10-CM

## 2024-05-08 DIAGNOSIS — E785 Hyperlipidemia, unspecified: Secondary | ICD-10-CM | POA: Diagnosis not present

## 2024-05-08 LAB — ECHOCARDIOGRAM COMPLETE
Area-P 1/2: 2.86 cm2
P 1/2 time: 559 ms
S' Lateral: 2.7 cm

## 2024-05-13 DIAGNOSIS — M0589 Other rheumatoid arthritis with rheumatoid factor of multiple sites: Secondary | ICD-10-CM | POA: Diagnosis not present

## 2024-05-15 ENCOUNTER — Ambulatory Visit: Attending: Internal Medicine | Admitting: Internal Medicine

## 2024-05-15 ENCOUNTER — Ambulatory Visit: Admitting: Emergency Medicine

## 2024-05-15 ENCOUNTER — Encounter: Payer: Self-pay | Admitting: Internal Medicine

## 2024-05-15 ENCOUNTER — Ambulatory Visit: Admitting: Student

## 2024-05-15 VITALS — BP 126/70 | HR 76 | Ht 75.0 in | Wt 202.8 lb

## 2024-05-15 DIAGNOSIS — M069 Rheumatoid arthritis, unspecified: Secondary | ICD-10-CM

## 2024-05-15 DIAGNOSIS — E785 Hyperlipidemia, unspecified: Secondary | ICD-10-CM | POA: Diagnosis not present

## 2024-05-15 DIAGNOSIS — R931 Abnormal findings on diagnostic imaging of heart and coronary circulation: Secondary | ICD-10-CM | POA: Diagnosis not present

## 2024-05-15 DIAGNOSIS — I872 Venous insufficiency (chronic) (peripheral): Secondary | ICD-10-CM | POA: Diagnosis not present

## 2024-05-15 DIAGNOSIS — I10 Essential (primary) hypertension: Secondary | ICD-10-CM

## 2024-05-15 NOTE — Progress Notes (Signed)
 Cardiology Office Note:  .   Date:  05/15/2024  ID:  Eric Solomon, DOB [birth year], MRN [id number] PCP: Clarice Nottingham, MD  [facility] Providers Cardiologist:  Soyla DELENA Merck, MD    History of Present Illness: .   Eric Solomon is a 73 y.o. male.  Discussed the use of AI scribe software for clinical note transcription with the patient, who gave verbal consent to proceed.  History of Present Illness Eric Solomon is a 73 year old male with coronary artery disease who presents for follow-up.  He has a calcium score of 3630 from July 2022, in the 97th percentile for his age and sex. He takes aspirin 81 mg daily and experiences no chest pain, shortness of breath, or fainting.  His hyperlipidemia is managed with rosuvastatin 40 mg daily and ezetimibe 10 mg daily. His most recent LDL is 59. Ezetimibe was added in July after an increase in LDL to 74, which has since returned to previous levels without side effects.  Hypertension is managed with triamterene/HCTZ 37.5/25 mg daily, switched from amlodipine  in July due to ankle swelling.  He has mild mitral and aortic valve regurgitation. The latest echocardiogram shows trivial mitral valve regurgitation and mild aortic valve regurgitation, stable from prior studies.  Venous insufficiency with ulcers on his right leg was treated successfully with procedures at a vein clinic. He wears a compression sock on his right leg and will follow up in February.    ROS: negative except per HPI above.  Studies Reviewed: SABRA   EKG Interpretation Date/Time:  Wednesday May 15 2024 13:12:38 EST Ventricular Rate:  76 PR Interval:  182 QRS Duration:  100 QT Interval:  390 QTC Calculation: 438 R Axis:   15  Text Interpretation: Normal sinus rhythm Normal ECG Confirmed by Merck Soyla ([id number]) on 05/15/2024 1:13:56 PM    Results LABS LDL: 74 (12/21/2023) LDL: 59 (01/23/2024)  RADIOLOGY Calcium score: 3630, 97th percentile  for age and sex (12/2020)  DIAGNOSTIC Echocardiogram: Trivial mitral valve regurgitation, mild aortic valve regurgitation, aortic root 39 mm, stable from prior study EKG: Normal (05/15/2024) Risk Assessment/Calculations:       Physical Exam:   VS:  BP 126/70   Pulse 76   Ht 6' 3 (1.905 m)   Wt 202 lb 12.8 oz (92 kg)   SpO2 95%   BMI 25.35 kg/m    Wt Readings from Last 3 Encounters:  05/15/24 202 lb 12.8 oz (92 kg)  05/24/23 200 lb (90.7 kg)  10/20/22 186 lb 12.8 oz (84.7 kg)     Physical Exam GENERAL: Alert, cooperative, well developed, no acute distress. HEENT: Normocephalic, normal oropharynx, moist mucous membranes. CHEST: Clear to auscultation bilaterally, no wheezes, rhonchi, or crackles. CARDIOVASCULAR: Normal heart rate and rhythm, S1 and S2 normal without murmurs. ABDOMEN: Soft, non-tender, non-distended, without organomegaly, normal bowel sounds. EXTREMITIES: No cyanosis, edema, or ankle swelling. NEUROLOGICAL: Cranial nerves grossly intact, moves all extremities without gross motor or sensory deficit.   ASSESSMENT AND PLAN: .    Assessment and Plan Assessment & Plan Coronary artery disease with elevated coronary calcium score Coronary artery disease with a calcium score of 3630, 97th percentile for age and sex. No symptoms.  Patient and I have previously discussed stress testing given high calcium score, and in shared decision making have deferred due to patient's wife's health needs and lack of symptoms. - Continue rosuvastatin 40 mg daily and aspirin 81 mg daily. - Monitor for symptoms: chest  pain, shortness of breath, syncope. - Contact provider if symptoms develop for potential stress testing.  Hyperlipidemia on statin and ezetimibe therapy Hyperlipidemia managed with rosuvastatin and ezetimibe. LDL decreased with ezetimibe. Goal: maintain LDL in 50s, acceptable 30-40 mg/dL. - Continue rosuvastatin 40 mg daily. - Continue ezetimibe 10 mg daily. -  Coordinate with primary care for lipid panel recheck.  Essential hypertension, transitioned to triamterene-HCTZ Therapy transitioned from amlodipine  5 mg daily to triamterene-HCTZ due to ankle swelling. Blood pressure well-controlled. - Continue triamterene-HCTZ 37.5/25 mg daily.  Chronic venous insufficiency of right lower extremity with healed ulcers Chronic venous insufficiency with healed ulcers post vein restoration treatment. Compression stockings in use. - Continue wearing compression stockings on the right leg. - Follow up with vein restoration clinic in February.  Rheumatoid arthritis on methotrexate and folic acid Rheumatoid arthritis managed with methotrexate and folic acid. Methotrexate increased to five pills a week. - Continue methotrexate and folic acid as prescribed.  Mild aortic valve regurgitation and trivial mitral valve regurgitation, stable Mild aortic and trivial mitral valve regurgitation stable. Aortic root size normal. No significant changes in valve function over three years. - Repeat echocardiogram in two years.  Recording duration: 22 minutes      Filippa Yarbough, MD, FACC

## 2024-05-15 NOTE — Patient Instructions (Signed)
 Medication Instructions:  No Changes  Lab Work: None  Follow-Up: At [facility], you and your health needs are our priority.  As part of our continuing mission to provide you with exceptional heart care, our providers are all part of one team.  This team includes your primary Cardiologist (physician) and Advanced Practice Providers or APPs (Physician Assistants and Nurse Practitioners) who all work together to provide you with the care you need, when you need it.  Your next appointment:   1 year(s)  Provider:   Gayatri A Acharya, MD     Other Instructions Please call us  or send a MyChart message with any Cardiology related questions/concerns.  [phone].  Thank you!
